# Patient Record
Sex: Female | Born: 1990 | Race: White | Hispanic: No | Marital: Married | Smoking: Never smoker
Health system: Southern US, Community
[De-identification: ages and names within clinical notes are randomized; demographics above are authoritative.]

## PROBLEM LIST (undated history)

## (undated) ENCOUNTER — Inpatient Hospital Stay (HOSPITAL_COMMUNITY): Payer: Self-pay

## (undated) DIAGNOSIS — Z789 Other specified health status: Secondary | ICD-10-CM

## (undated) DIAGNOSIS — E669 Obesity, unspecified: Secondary | ICD-10-CM

## (undated) HISTORY — PX: NO PAST SURGERIES: SHX2092

---

## 2013-12-17 ENCOUNTER — Encounter (HOSPITAL_COMMUNITY): Payer: Self-pay | Admitting: Emergency Medicine

## 2013-12-17 ENCOUNTER — Emergency Department (HOSPITAL_COMMUNITY)
Admission: EM | Admit: 2013-12-17 | Discharge: 2013-12-17 | Disposition: A | Discharge status: Home / Self Care | Payer: BC Managed Care – PPO | Attending: Emergency Medicine | Admitting: Emergency Medicine

## 2013-12-17 DIAGNOSIS — Y9289 Other specified places as the place of occurrence of the external cause: Secondary | ICD-10-CM | POA: Insufficient documentation

## 2013-12-17 DIAGNOSIS — Y9389 Activity, other specified: Secondary | ICD-10-CM | POA: Insufficient documentation

## 2013-12-17 DIAGNOSIS — W540XXA Bitten by dog, initial encounter: Secondary | ICD-10-CM | POA: Insufficient documentation

## 2013-12-17 DIAGNOSIS — S61209A Unspecified open wound of unspecified finger without damage to nail, initial encounter: Secondary | ICD-10-CM | POA: Insufficient documentation

## 2013-12-17 MED ORDER — MUPIROCIN CALCIUM 2 % EX CREA
TOPICAL_CREAM | Freq: Once | CUTANEOUS | Status: DC
  Filled 2013-12-17: qty 15

## 2013-12-17 MED ORDER — MUPIROCIN 2 % EX OINT
TOPICAL_OINTMENT | Freq: Two times a day (BID) | CUTANEOUS | Status: DC
  Administered 2013-12-17: 15:00:00 via NASAL
  Filled 2013-12-17: qty 22

## 2013-12-17 MED ORDER — AMOXICILLIN-POT CLAVULANATE 875-125 MG PO TABS: 1.0000 | ORAL_TABLET | Freq: Two times a day (BID) | ORAL | Status: DC

## 2013-12-17 NOTE — ED Provider Notes (Signed)
Medical screening examination/treatment/procedure(s) were performed by non-physician practitioner and as supervising physician I was immediately available for consultation/collaboration.   EKG Interpretation None        Layla MawKristen N Ward, DO 12/17/13 1642

## 2013-12-17 NOTE — ED Notes (Signed)
Pt. Bitten by a dog rt. 3rd finger.  Finger intact,  Bleeding controlled.  Open areas noted ,  Finger is swollen.  Pt. Is not sure if the dog is UTD with vaccinations.

## 2013-12-17 NOTE — ED Provider Notes (Signed)
CSN: [id number]     Arrival date & time 12/17/13  1349 History  This chart was scribed for non-physician practitioner, Junious SilkHannah Merrell, PA-C,working with Layla MawKristen N Ward, DO, by Karle PlumberJennifer Tensley, ED Scribe.  This patient was seen in room TR06C/TR06C and the patient's care was started at 2:03 PM.  Chief Complaint  Patient presents with  . Animal Bite    The history is provided by the patient. No language interpreter was used.   HPI Comments:  Judith Ruiz is a 23 y.o. female who presents to the Emergency Department complaining of a dog bite to her right third middle finger PTA. She states her dog got into a fight with the neighbor's dog and she stuck her hand between them trying to break them up. She reports associated mild pain and swelling. Pt states she is unsure of the dog's vaccination record, but her neighbor has possession of the dog. She denies numbness or tingling of the finger. She states she is UTD on her tetanus vaccination within 5 years. Pt states she is right hand dominant.   History reviewed. No pertinent past medical history. History reviewed. No pertinent past surgical history. No family history on file. History  Substance Use Topics  . Smoking status: Never Smoker   . Smokeless tobacco: Not on file  . Alcohol Use: No   OB History   Grav Para Term Preterm Abortions TAB SAB Ect Mult Living                 Review of Systems  Skin: Positive for wound (right middle finger).  Neurological: Negative for numbness.  All other systems reviewed and are negative.   Allergies  Review of patient's allergies indicates no known allergies.  Home Medications   Prior to Admission medications   Not on File   Filed Vitals:   12/17/13 1424  BP: 140/81  Pulse: 104  Temp: 98.8 F (37.1 C)  Resp: 20     Physical Exam  Nursing note and vitals reviewed. Constitutional: She is oriented to person, place, and time. She appears well-developed and well-nourished. No  distress.  HENT:  Head: Normocephalic and atraumatic.  Right Ear: External ear normal.  Left Ear: External ear normal.  Nose: Nose normal.  Mouth/Throat: Oropharynx is clear and moist.  Eyes: Conjunctivae are normal.  Neck: Normal range of motion.  Cardiovascular: Normal rate, regular rhythm and normal heart sounds.   Pulmonary/Chest: Effort normal and breath sounds normal. No stridor. No respiratory distress. She has no wheezes. She has no rales.  Abdominal: Soft. She exhibits no distension.  Musculoskeletal: Normal range of motion. She exhibits edema and tenderness.  Superficial lacerations on dorsal aspect of right middle finger. No foreign bodies. Flexion and extension of finger intact. Compartments soft. Mild swelling noted to finger. Very mild tenderness to palpation.   Neurological: She is alert and oriented to person, place, and time. She has normal strength.  Sensation intact.  Skin: Skin is warm and dry. She is not diaphoretic. No erythema.  Psychiatric: She has a normal mood and affect. Her behavior is normal.    ED Course  Procedures (including critical care time) DIAGNOSTIC STUDIES: Oxygen Saturation is 98% on RA, normal by my interpretation.   COORDINATION OF CARE: 2:07 PM- Will prescribe antibiotic. Offered medication for pain but pt declined. Pt verbalizes understanding and agrees to plan.  Medications  mupirocin ointment (BACTROBAN) 2 % ( Nasal Given 12/17/13 1435)    Labs Review Labs Reviewed -  No data to display  Imaging Review No results found.   EKG Interpretation None      MDM   Final diagnoses:  Dog bite    Patient presents to ED with complaint of dog bite. Wound cleaned in ED. Mild tenderness to palpation. No foreign bodies. Full ROM, flexion and extension. Patient was given Augmentin. Patient's neighbors own dog and still have dog in possession. She will contact animal control to ensure dog is UTD on rabies. Discussed strict return  instructions. Vital signs stable for discharge. Patient / Family / Caregiver informed of clinical course, understand medical decision-making process, and agree with plan.   I personally performed the services described in this documentation, which was scribed in my presence. The recorded information has been reviewed and is accurate.    Mora BellmanHannah S Merrell, PA-C 12/17/13 1441

## 2013-12-17 NOTE — Discharge Instructions (Signed)
It is very important you talk to your neighbor and animal control to ensure the dog is up to date on the rabies vaccine. If the dog is not up to date on it's rabies vaccine you must return to the emergency department for the rabies vaccine and immunoglobulin. Return to the ED sooner if you develop worsening swelling, drainage, fevers, increased pain, or any symptoms concerning to you.  Animal Bite Animal bite wounds can get infected. It is important to get proper medical treatment. Ask your doctor if you need a rabies shot. HOME CARE   Follow your doctor's instructions for taking care of your wound.  Only take medicine as told by your doctor.  Take your medicine (antibiotics) as told. Finish them even if you start to feel better.  Keep all doctor visits as told. You may need a tetanus shot if:   You cannot remember when you had your last tetanus shot.  You have never had a tetanus shot.  The injury broke your skin. If you need a tetanus shot and you choose not to have one, you may get tetanus. Sickness from tetanus can be serious. GET HELP RIGHT AWAY IF:   Your wound is warm, red, sore, or puffy (swollen).  You notice yellowish-white fluid (pus) or a bad smell coming from the wound.  You see a red line on the skin coming from the wound.  You have a fever, chills, or you feel sick.  You feel sick to your stomach (nauseous), or you throw up (vomit).  Your pain does not go away, or it gets worse.  You have trouble moving the injured part.  You have questions or concerns. MAKE SURE YOU:   Understand these instructions.  Will watch your condition.  Will get help right away if you are not doing well or get worse. Document Released: 06/16/2005 Document Revised: 09/08/2011 Document Reviewed: 02/05/2011 [facility] Patient Information 2015 [employer] [place]. This information is not intended to replace advice given to you by your health care provider. Make sure you discuss any questions  you have with your health care provider.

## 2013-12-23 ENCOUNTER — Ambulatory Visit (INDEPENDENT_AMBULATORY_CARE_PROVIDER_SITE_OTHER): Payer: BC Managed Care – PPO | Admitting: Family Medicine

## 2013-12-23 VITALS — BP 126/74 | HR 72 | Temp 98.0°F | Resp 16 | Wt 260.0 lb

## 2013-12-23 DIAGNOSIS — N926 Irregular menstruation, unspecified: Secondary | ICD-10-CM

## 2013-12-23 DIAGNOSIS — N912 Amenorrhea, unspecified: Secondary | ICD-10-CM

## 2013-12-23 LAB — POCT URINE PREGNANCY: Preg Test, Ur: NEGATIVE

## 2013-12-23 LAB — TSH: TSH: 3.489 u[IU]/mL (ref 0.350–4.500)

## 2013-12-23 NOTE — Patient Instructions (Signed)
The blood work is pending. If these do not give us an answer we will probably have to refer your to a specialist for further evaluation.

## 2013-12-23 NOTE — Progress Notes (Signed)
Subjective; 23 year old lady who has not had a regular menstrual cycle since April 8. She has been generally healthy. She's been married for about 6 months. They use condoms initially, but has not used any contraception. She is content with it if she is not pregnant. However she is run home pregnancy test is negative. She did do a little spotting on the 16th and 27th of April but none since. She has not had any major other symptoms such as breast tenderness or nausea. Her weight has been fairly constant over the last year, maybe a 5 pound loss. Physically she is healthy with the exception of a history of glaucoma for which she uses eyedrops.  Objective: Pleasant alert healthy appearing young lady in no major distress, overweight. No CVA tenderness. Abdomen soft with mild midline suprapubic tenderness.  Normal external genitalia. Vaginal mucosa unremarkable. Cervix appears normal. Bimanual exam reveals no adnexal or uterine masses. The patient expressed no desire and need for STD testing.  Results for orders placed in visit on 12/23/13  POCT URINE PREGNANCY      Result Value Ref Range   Preg Test, Ur Negative     Assessment: Secondary Amenorrhea, not pregnant   Plan: Pulmonary workup with FSH, prolactin, serum hCG, and TSH If these are not revealing, we will refer her to a gynecologist.

## 2013-12-24 LAB — HCG, SERUM, QUALITATIVE: Preg, Serum: NEGATIVE

## 2013-12-24 LAB — FOLLICLE STIMULATING HORMONE: FSH: 7.4 m[IU]/mL

## 2013-12-24 LAB — PROLACTIN: Prolactin: 5.5 ng/mL

## 2014-08-24 ENCOUNTER — Ambulatory Visit (INDEPENDENT_AMBULATORY_CARE_PROVIDER_SITE_OTHER): Payer: BLUE CROSS/BLUE SHIELD | Admitting: Sports Medicine

## 2014-08-24 VITALS — BP 110/76 | HR 79 | Temp 98.5°F | Resp 20 | Ht 64.5 in | Wt 270.2 lb

## 2014-08-24 DIAGNOSIS — Z23 Encounter for immunization: Secondary | ICD-10-CM

## 2014-08-24 DIAGNOSIS — N915 Oligomenorrhea, unspecified: Secondary | ICD-10-CM

## 2014-08-24 NOTE — Progress Notes (Addendum)
I have reviewed and agree with documentation by Dr. Berline Choughigby.  Called pt and asked her to take a home HCG to ensure no current pregnancy and she plans to do so

## 2014-08-24 NOTE — Progress Notes (Signed)
  Gerre PebblesChelsey Martinez - 24 y.o. female MRN [id number]  Date of birth: Nov 13, 1990  SUBJECTIVE: CC:  Chief Complaint  Patient presents with  . Immunizations    HPV & HEP A    HPI: Needs final immunization  Pt has [facility] records with her printed 08/23/2014.   Guardisil & Hep A - 11/2010  Guardisil - 12/2010   Health Maintenance  Due for PAP - declines today  Persistent Oligomenorrhea, reports LMP approximately 6 weeks ago with 2-3 days of bleeding  No contraception; not actively trying to become pregnant but would be happy if she became pregnant  ROS: no fevers, chills, unexpected wt gain or wt loss however 10# gain Wt Readings from Last 3 Encounters:  08/24/14 270 lb 4 oz (122.585 kg)  12/23/13 260 lb (117.935 kg)   HISTORY:  Past Medical, Surgical, Social, and Family History reviewed & updated per EMR.  Pertinent Historical Findings include:  reports that she has never smoked. She has never used smokeless tobacco. Obesity & Oligiomenorrhea Otherwise Healthy   Historical Data Reviewed: Results for Gerre PebblesMARTINEZ, Lynzi (MRN [id number]) as of 08/24/2014 20:47  12/23/2013 12:24  FSH 7.4  Prolactin 5.5  Preg, Serum NEG  TSH 3.489    OBJECTIVE:  VS:   HT:5' 4.5" (163.8 cm)   WT:270 lb 4 oz (122.585 kg)  BMI:45.8          BP:110/76 mmHg  HR:79bpm  TEMP:98.5 F (36.9 C)(Oral)  RESP:99 %  PHYSICAL EXAM:  Physical Exam  Constitutional: She is well-developed, well-nourished, and in no distress. No distress.  HENT:  Head: Normocephalic and atraumatic.  Eyes: Right eye exhibits no discharge. Left eye exhibits no discharge. No scleral icterus.  Pulmonary/Chest: Effort normal. No respiratory distress.  Skin: She is not diaphoretic.  Psychiatric: Mood, memory, affect and judgment normal.   ASSESSMENT: 1. Oligomenorrhea    PLAN: See problem based charting & AVS for additional documentation.  Final Immunizations for Hep A & Guardsil  Refer to OB for Wellness visit and  Oligiomenorrhea > Return if symptoms worsen or fail to improve.

## 2014-08-28 ENCOUNTER — Telehealth: Payer: Self-pay | Admitting: Family Medicine

## 2014-08-28 NOTE — Telephone Encounter (Signed)
Called to check in with her- asked her to take a home HCG to confirm not pregnant.  She plans to do so

## 2014-08-28 NOTE — Telephone Encounter (Signed)
-----   Message from Andrena MewsMichael D Rigby, DO sent at 08/25/2014  4:56 PM EST ----- Yes I did ask her.  LMP ~6 weeks ago with 2-3 days of bleeding; reportedly normal and long standing for her.  Would you have still checked UPREG before giving the Hep A?

## 2015-01-04 ENCOUNTER — Ambulatory Visit (INDEPENDENT_AMBULATORY_CARE_PROVIDER_SITE_OTHER): Payer: BLUE CROSS/BLUE SHIELD | Admitting: Family Medicine

## 2015-01-04 VITALS — BP 110/80 | HR 90 | Temp 98.9°F | Resp 16 | Ht 64.5 in | Wt 267.8 lb

## 2015-01-04 DIAGNOSIS — O99210 Obesity complicating pregnancy, unspecified trimester: Secondary | ICD-10-CM | POA: Insufficient documentation

## 2015-01-04 DIAGNOSIS — Z3200 Encounter for pregnancy test, result unknown: Secondary | ICD-10-CM

## 2015-01-04 DIAGNOSIS — E669 Obesity, unspecified: Secondary | ICD-10-CM

## 2015-01-04 LAB — POCT URINE PREGNANCY: Preg Test, Ur: POSITIVE — AB

## 2015-01-04 NOTE — Patient Instructions (Signed)
Congratulations!   Take good care of yourself, get enough rest and take your pre-natal vitamin A book such as "what to expect while your expecting" or "your pregnancy week by week" can be helpful If you have bleeding, pain, or other concerning symptoms [facility] [address] It is hard to estimate your due date due to your irregular periods- an ultrasound will help here!   Please call and schedule with the OB-GYN provider of your choice asap-   [facility] ?  Address: [address], [place], [place] [address]  Phone: [phone]   [facility] ?  Address: [address], [place], [place] [address]  Phone: [phone]   [facility] [address] [place], [place] [address] [phone]

## 2015-01-04 NOTE — Progress Notes (Signed)
[facility] [address], [place] [address] [phone]  Date:  01/04/2015   Name:  Judith Ruiz   DOB:  09/11/1990   MRN:  [id number]  PCP:  No PCP Per Patient    Chief Complaint: pregnancy test request   History of Present Illness:  Judith Ruiz is a 24 y.o. very pleasant female patient who presents with the following:  Here today seeking a pregnancy test She took a positive home test - she took this about one week ago She is married -for aboug 18 months- and is pleased about the pregnancy Her LMP was 5/6-no bleeding since then, but she does tend to have irregular bleeding. She took a home test mid June which was negative so she suspect she concieved more recently.   She has noted breast tenderness and some cramping but no pain, no bleeding. These sx have been present for about 2 weeks   This is her first pregnacy She is otherwise in good health She does not have any OBG- needs to fine one She just started PNV yesterday   There are no active problems to display for this patient.   History reviewed. No pertinent past medical history.  History reviewed. No pertinent past surgical history.  History  Substance Use Topics  . Smoking status: Never Smoker   . Smokeless tobacco: Never Used  . Alcohol Use: No    Family History  Problem Relation Age of Onset  . Hyperlipidemia Father     No Known Allergies  Medication list has been reviewed and updated.  No current outpatient prescriptions on file prior to visit.   No current facility-administered medications on file prior to visit.    Review of Systems:  As per HPI- otherwise negative.   Physical Examination: Filed Vitals:   01/04/15 1129  BP: 110/80  Pulse: 100  Temp: 98.9 F (37.2 C)  Resp: 16   Filed Vitals:   01/04/15 1129  Height: 5' 4.5" (1.638 m)  Weight: 267 lb 12.8 oz (121.473 kg)   Body mass index is 45.27 kg/(m^2). Ideal Body Weight: Weight in (lb) to  have BMI = 25: 147.6  GEN: WDWN, NAD, Non-toxic, A & O x 3, obese, looks well HEENT: Atraumatic, Normocephalic. Neck supple. No masses, No LAD. Ears and Nose: No external deformity. CV: RRR, No M/G/R. No JVD. No thrill. No extra heart sounds. PULM: CTA B, no wheezes, crackles, rhonchi. No retractions. No resp. distress. No accessory muscle use. ABD: S, NT, ND. No rebound. No HSM.  Belly is benign EXTR: No c/c/e NEURO Normal gait.  PSYCH: Normally interactive. Conversant. Not depressed or anxious appearing.  Calm demeanor.   Results for orders placed or performed in visit on 01/04/15  POCT urine pregnancy  Result Value Ref Range   Preg Test, Ur Positive (A) Negative    Assessment and Plan: Possible pregnancy - Plan: POCT urine pregnancy  Found to be pregnant today- she is pleased.  Dates are uncertain due to irregular bleeding Discussed self- care, continue PNV, encouraged her to see the OBG of her choice asap   Signed Abbe AmsterdamJessica Copland, MD

## 2015-02-05 LAB — OB RESULTS CONSOLE ANTIBODY SCREEN: Antibody Screen: NEGATIVE

## 2015-02-05 LAB — OB RESULTS CONSOLE GC/CHLAMYDIA
Chlamydia: NEGATIVE
Gonorrhea: NEGATIVE

## 2015-02-05 LAB — OB RESULTS CONSOLE ABO/RH: RH Type: NEGATIVE

## 2015-02-05 LAB — OB RESULTS CONSOLE RUBELLA ANTIBODY, IGM: Rubella: IMMUNE

## 2015-02-05 LAB — OB RESULTS CONSOLE HEPATITIS B SURFACE ANTIGEN: HEP B S AG: NEGATIVE

## 2015-02-05 LAB — OB RESULTS CONSOLE RPR: RPR: NONREACTIVE

## 2015-02-05 LAB — OB RESULTS CONSOLE HIV ANTIBODY (ROUTINE TESTING): HIV: NONREACTIVE

## 2015-07-01 NOTE — L&D Delivery Note (Signed)
Delivery Note At 7:02 PM a viable and healthy female was delivered via Vaginal, Vacuum (Extractor) (Presentation: Left Occiput Anterior).  APGAR: 8, 9; weight pending .   Placenta status: Intact, Spontaneous.  Cord: 3 vessels   With pushing the fetal tracing was noted to be having decelerations after contractions. The Mother was noted to be tachycardic and it was difficult to determine whether the FHR was tracing maternal or fetal. The pt had pushed the vertex almost to crowning and the decision was made to expedite delivery with the kiwi vacuum. The vacuum was applied and with a single contractions the infants head was delivered to crowning without a pop-off. The infants body was then delivered and the passed to the waiting to the maternal abdomen. With pushing the maternal core was palpably warm and there was an odor of chorioamnionitis with delivery of the baby. The cord was clamped and cut and the placenta was delivered spontaneously, intact, with 3V cord. Right after delivery the uterus was noted to be boggy. This resolved with bimanual massage. A 2nd degree perineal laceration was repaired with 3-0 vicryl.   Anesthesia: Epidural  Episiotomy: None Lacerations: 2nd degree;Perineal Suture Repair: 3.0 vicryl Est. Blood Loss (mL):  500 cc  Mom to postpartum.  Baby to Couplet care / Skin to Skin.  Caitriona Sundquist H. 08/28/2015, 7:55 PM

## 2015-07-27 LAB — OB RESULTS CONSOLE GBS: STREP GROUP B AG: NEGATIVE

## 2015-08-22 ENCOUNTER — Encounter (HOSPITAL_COMMUNITY): Payer: Self-pay | Admitting: Advanced Practice Midwife

## 2015-08-22 ENCOUNTER — Inpatient Hospital Stay (HOSPITAL_COMMUNITY)
Admission: AD | Admit: 2015-08-22 | Discharge: 2015-08-22 | Disposition: A | Discharge status: Home / Self Care | Payer: Medicaid Other | Source: Ambulatory Visit | Attending: Obstetrics and Gynecology | Admitting: Obstetrics and Gynecology

## 2015-08-22 DIAGNOSIS — O133 Gestational [pregnancy-induced] hypertension without significant proteinuria, third trimester: Secondary | ICD-10-CM | POA: Diagnosis not present

## 2015-08-22 DIAGNOSIS — I10 Essential (primary) hypertension: Secondary | ICD-10-CM | POA: Diagnosis present

## 2015-08-22 DIAGNOSIS — Z3A39 39 weeks gestation of pregnancy: Secondary | ICD-10-CM | POA: Diagnosis not present

## 2015-08-22 HISTORY — DX: Other specified health status: Z78.9

## 2015-08-22 LAB — URINALYSIS, ROUTINE W REFLEX MICROSCOPIC
BILIRUBIN URINE: NEGATIVE
Glucose, UA: NEGATIVE mg/dL
Ketones, ur: NEGATIVE mg/dL
Leukocytes, UA: NEGATIVE
NITRITE: NEGATIVE
PROTEIN: 30 mg/dL — AB
Specific Gravity, Urine: 1.015 (ref 1.005–1.030)
pH: 7 (ref 5.0–8.0)

## 2015-08-22 LAB — CBC
HCT: 32.7 % — ABNORMAL LOW (ref 36.0–46.0)
Hemoglobin: 10.6 g/dL — ABNORMAL LOW (ref 12.0–15.0)
MCH: 24 pg — ABNORMAL LOW (ref 26.0–34.0)
MCHC: 32.4 g/dL (ref 30.0–36.0)
MCV: 74.1 fL — ABNORMAL LOW (ref 78.0–100.0)
Platelets: 270 10*3/uL (ref 150–400)
RBC: 4.41 MIL/uL (ref 3.87–5.11)
RDW: 15.3 % (ref 11.5–15.5)
WBC: 10.1 10*3/uL (ref 4.0–10.5)

## 2015-08-22 LAB — COMPREHENSIVE METABOLIC PANEL
ALT: 15 U/L (ref 14–54)
AST: 16 U/L (ref 15–41)
Albumin: 2.9 g/dL — ABNORMAL LOW (ref 3.5–5.0)
Alkaline Phosphatase: 168 U/L — ABNORMAL HIGH (ref 38–126)
Anion gap: 15 (ref 5–15)
BUN: 10 mg/dL (ref 6–20)
CHLORIDE: 104 mmol/L (ref 101–111)
CO2: 20 mmol/L — ABNORMAL LOW (ref 22–32)
CREATININE: 0.57 mg/dL (ref 0.44–1.00)
Calcium: 8.5 mg/dL — ABNORMAL LOW (ref 8.9–10.3)
GFR calc non Af Amer: >60 mL/min (ref >60)
Glucose, Bld: 78 mg/dL (ref 65–99)
POTASSIUM: 3.5 mmol/L (ref 3.5–5.1)
Sodium: 139 mmol/L (ref 135–145)
Total Bilirubin: 0.4 mg/dL (ref 0.3–1.2)
Total Protein: 6.9 g/dL (ref 6.5–8.1)

## 2015-08-22 LAB — PROTEIN / CREATININE RATIO, URINE
Creatinine, Urine: 178 mg/dL
Protein Creatinine Ratio: 0.19 mg/mg{Cre} — ABNORMAL HIGH (ref 0.00–0.15)
TOTAL PROTEIN, URINE: 33 mg/dL

## 2015-08-22 LAB — URINE MICROSCOPIC-ADD ON

## 2015-08-22 NOTE — MAU Note (Signed)
Pt was sent here from the office to monitor BPs, was "high in the office today and some protein has been in my urine the last couple of visits."  Denies any headaches or blurred vision; denies any bleeding or leaking of fluid. Reports positive fetal movement.

## 2015-08-22 NOTE — MAU Provider Note (Signed)
Chief Complaint:  Hypertension   First Provider Initiated Contact with Patient 08/22/15 2015     HPI: Judith Ruiz is a 25 y.o. G1P0 at 86w0dwho presents to maternity admissions reporting elevated BPs in office with proteinuria. She reports good fetal movement, denies LOF, vaginal bleeding, vaginal itching/burning, urinary symptoms, h/a, dizziness, n/v, diarrhea, constipation or fever/chills.  She denies headache, visual changes or abdominal pain.  Hypertension This is a new problem. The current episode started in the past 7 days. The problem has been waxing and waning since onset. Associated symptoms include peripheral edema (pedal and pretibial). Pertinent negatives include no anxiety, blurred vision, chest pain, headaches, malaise/fatigue, palpitations, shortness of breath or sweats. There are no associated agents to hypertension. There are no known risk factors for coronary artery disease. Past treatments include nothing. There are no compliance problems.  pregnancy.   RN Note: Pt was sent here from the office to monitor BPs, was "high in the office today and some protein has been in my urine the last couple of visits." Denies any headaches or blurred vision; denies any bleeding or leaking of fluid. Reports positive fetal movement.            Past Medical History: Past Medical History  Diagnosis Date  . Medical history non-contributory     Past obstetric history: OB History  Gravida Para Term Preterm AB SAB TAB Ectopic Multiple Living  1             # Outcome Date GA Lbr Len/2nd Weight Sex Delivery Anes PTL Lv  1 Current               Past Surgical History: Past Surgical History  Procedure Laterality Date  . No past surgeries      Family History: Family History  Problem Relation Age of Onset  . Hyperlipidemia Father     Social History: Social History  Substance Use Topics  . Smoking status: Never Smoker   . Smokeless tobacco: Never Used  . Alcohol  Use: No    Allergies: No Known Allergies  Meds:  Prescriptions prior to admission  Medication Sig Dispense Refill Last Dose  . Prenatal Vit-Fe Fumarate-FA (PRENATAL MULTIVITAMIN) TABS tablet Take 1 tablet by mouth daily at 12 noon.   Past Week at Unknown time    I have reviewed patient's Past Medical Hx, Surgical Hx, Family Hx, Social Hx, medications and allergies.   ROS:  Review of Systems  Constitutional: Negative for fever, chills, malaise/fatigue and fatigue.  HENT: Negative for sinus pressure.   Eyes: Negative for blurred vision.  Respiratory: Negative for cough and shortness of breath.   Cardiovascular: Positive for leg swelling. Negative for chest pain and palpitations.  Gastrointestinal: Negative for nausea, vomiting, abdominal pain, diarrhea and constipation.  Genitourinary: Negative for dysuria, vaginal bleeding, vaginal discharge and pelvic pain.  Musculoskeletal: Negative for back pain.  Neurological: Negative for dizziness, syncope, speech difficulty, weakness and headaches.   Other systems negative  Physical Exam  Patient Vitals for the past 24 hrs:  BP Temp Temp src Pulse Resp SpO2 Height Weight  08/22/15 1956 126/80 mmHg 98.8 F (37.1 C) Oral 100 20 99 %  (1.651 m) 290 lb (131.543 kg)    Constitutional: Well-developed, well-nourished female in no acute distress.  Cardiovascular: normal rate and rhythm Respiratory: normal effort, clear to auscultation bilaterally GI: Abd soft, non-tender, gravid appropriate for gestational age.   No rebound or guarding. MS: Extremities nontender, no edema, normal ROM  Neurologic: Alert and oriented x 4.   DTRs 2+, no clonus, speech not impaired.  GU: Neg CVAT.    FHT:  Baseline 135-140 , moderate variability, accelerations present, no decelerations Contractions: occasional irritability   Labs:  Imaging:  No results found.  MAU Course/MDM: .2136: D/W Dr. Tenny Craw, and reviewed labs. Patient is ok for DC home.      Assessment: 1. Transient hypertension of pregnancy in third trimester     Plan: Report given to oncoming CNM Awaiting lab results   DC home Comfort measures reviewed  3rd Trimester precautions  Labor precautions  Fetal kick counts Pre-eclampsia warning signs  RX: none  Return to MAU as needed FU with OB as planned  Follow-up Information    Follow up with Almon Hercules., MD.   Specialty:  Obstetrics and Gynecology   Why:  As scheduled   Contact information:   [address] [place] [address] [phone]        Wynelle Bourgeois CNM, MSN Certified Nurse-Midwife 08/22/2015 8:15 PM

## 2015-08-22 NOTE — Discharge Instructions (Signed)

## 2015-08-24 ENCOUNTER — Other Ambulatory Visit: Payer: Self-pay | Admitting: Obstetrics and Gynecology

## 2015-08-27 ENCOUNTER — Inpatient Hospital Stay (HOSPITAL_COMMUNITY): Payer: Medicaid Other | Admitting: Anesthesiology

## 2015-08-27 ENCOUNTER — Inpatient Hospital Stay (HOSPITAL_COMMUNITY)
Admission: RE | Admit: 2015-08-27 | Discharge: 2015-08-30 | DRG: 775 | Disposition: A | Discharge status: Home / Self Care | Payer: Medicaid Other | Source: Ambulatory Visit | Attending: Obstetrics and Gynecology | Admitting: Obstetrics and Gynecology

## 2015-08-27 ENCOUNTER — Encounter (HOSPITAL_COMMUNITY): Payer: Self-pay

## 2015-08-27 DIAGNOSIS — O99214 Obesity complicating childbirth: Secondary | ICD-10-CM | POA: Diagnosis present

## 2015-08-27 DIAGNOSIS — Z6841 Body Mass Index (BMI) 40.0 and over, adult: Secondary | ICD-10-CM | POA: Diagnosis not present

## 2015-08-27 DIAGNOSIS — Z3A35 35 weeks gestation of pregnancy: Secondary | ICD-10-CM | POA: Diagnosis not present

## 2015-08-27 DIAGNOSIS — O1494 Unspecified pre-eclampsia, complicating childbirth: Principal | ICD-10-CM | POA: Diagnosis present

## 2015-08-27 LAB — TYPE AND SCREEN
ABO/RH(D): O NEG
ANTIBODY SCREEN: NEGATIVE

## 2015-08-27 LAB — CBC
HCT: 32.5 % — ABNORMAL LOW (ref 36.0–46.0)
Hemoglobin: 10.2 g/dL — ABNORMAL LOW (ref 12.0–15.0)
MCH: 23.6 pg — ABNORMAL LOW (ref 26.0–34.0)
MCHC: 31.4 g/dL (ref 30.0–36.0)
MCV: 75.1 fL — AB (ref 78.0–100.0)
PLATELETS: 282 10*3/uL (ref 150–400)
RBC: 4.33 MIL/uL (ref 3.87–5.11)
RDW: 15.7 % — AB (ref 11.5–15.5)
WBC: 10.5 10*3/uL (ref 4.0–10.5)

## 2015-08-27 LAB — ABO/RH: ABO/RH(D): O NEG

## 2015-08-27 MED ORDER — OXYTOCIN 10 UNIT/ML IJ SOLN
2.5000 [IU]/h | INTRAVENOUS | Status: DC
  Filled 2015-08-27: qty 4

## 2015-08-27 MED ORDER — LACTATED RINGERS IV SOLN
INTRAVENOUS | Status: DC
  Administered 2015-08-27: 08:00:00 via INTRAVENOUS
  Administered 2015-08-28: 125 mL/h via INTRAVENOUS

## 2015-08-27 MED ORDER — LIDOCAINE HCL (PF) 1 % IJ SOLN
INTRAMUSCULAR | Status: DC | PRN
  Administered 2015-08-27: 3 mL via EPIDURAL
  Administered 2015-08-27: 5 mL
  Administered 2015-08-27: 2 mL via EPIDURAL

## 2015-08-27 MED ORDER — PHENYLEPHRINE 40 MCG/ML (10ML) SYRINGE FOR IV PUSH (FOR BLOOD PRESSURE SUPPORT)
80.0000 ug | PREFILLED_SYRINGE | INTRAVENOUS | Status: DC | PRN
  Filled 2015-08-27: qty 2

## 2015-08-27 MED ORDER — LIDOCAINE HCL (PF) 1 % IJ SOLN
30.0000 mL | INTRAMUSCULAR | Status: DC | PRN
  Filled 2015-08-27: qty 30

## 2015-08-27 MED ORDER — FENTANYL 2.5 MCG/ML BUPIVACAINE 1/10 % EPIDURAL INFUSION (WH - ANES)
14.0000 mL/h | INTRAMUSCULAR | Status: DC | PRN
  Administered 2015-08-27 – 2015-08-28 (×5): 14 mL/h via EPIDURAL
  Filled 2015-08-27 (×4): qty 125

## 2015-08-27 MED ORDER — PHENYLEPHRINE 40 MCG/ML (10ML) SYRINGE FOR IV PUSH (FOR BLOOD PRESSURE SUPPORT)
80.0000 ug | PREFILLED_SYRINGE | INTRAVENOUS | Status: DC | PRN
  Filled 2015-08-27: qty 2
  Filled 2015-08-27: qty 20

## 2015-08-27 MED ORDER — LACTATED RINGERS IV SOLN
1.0000 m[IU]/min | INTRAVENOUS | Status: DC
  Administered 2015-08-28: 24 m[IU]/min via INTRAVENOUS

## 2015-08-27 MED ORDER — OXYTOCIN BOLUS FROM INFUSION
500.0000 mL | INTRAVENOUS | Status: DC
  Administered 2015-08-28: 500 mL via INTRAVENOUS

## 2015-08-27 MED ORDER — EPHEDRINE 5 MG/ML INJ
10.0000 mg | INTRAVENOUS | Status: DC | PRN
Start: 2015-08-27 — End: 2015-08-28
  Filled 2015-08-27: qty 2

## 2015-08-27 MED ORDER — OXYCODONE-ACETAMINOPHEN 5-325 MG PO TABS: 1.0000 | ORAL_TABLET | ORAL | Status: DC | PRN

## 2015-08-27 MED ORDER — LACTATED RINGERS IV SOLN: 500.0000 mL | Freq: Once | INTRAVENOUS | Status: DC

## 2015-08-27 MED ORDER — EPHEDRINE 5 MG/ML INJ
10.0000 mg | INTRAVENOUS | Status: DC | PRN
  Filled 2015-08-27: qty 2

## 2015-08-27 MED ORDER — DIPHENHYDRAMINE HCL 50 MG/ML IJ SOLN
12.5000 mg | INTRAMUSCULAR | Status: DC | PRN
Start: 2015-08-27 — End: 2015-08-28

## 2015-08-27 MED ORDER — ACETAMINOPHEN 325 MG PO TABS: 650.0000 mg | ORAL_TABLET | ORAL | Status: DC | PRN

## 2015-08-27 MED ORDER — OXYTOCIN 10 UNIT/ML IJ SOLN
1.0000 m[IU]/min | INTRAVENOUS | Status: DC
  Administered 2015-08-27: 1 m[IU]/min via INTRAVENOUS
  Filled 2015-08-27: qty 4

## 2015-08-27 MED ORDER — EPHEDRINE 5 MG/ML INJ: 10.0000 mg | INTRAVENOUS | Status: DC | PRN

## 2015-08-27 MED ORDER — PHENYLEPHRINE 40 MCG/ML (10ML) SYRINGE FOR IV PUSH (FOR BLOOD PRESSURE SUPPORT): 80.0000 ug | PREFILLED_SYRINGE | INTRAVENOUS | Status: DC | PRN

## 2015-08-27 MED ORDER — CITRIC ACID-SODIUM CITRATE 334-500 MG/5ML PO SOLN: 30.0000 mL | ORAL | Status: DC | PRN

## 2015-08-27 MED ORDER — FLEET ENEMA 7-19 GM/118ML RE ENEM: 1.0000 | ENEMA | RECTAL | Status: DC | PRN

## 2015-08-27 MED ORDER — LACTATED RINGERS IV SOLN
500.0000 mL | INTRAVENOUS | Status: DC | PRN
  Administered 2015-08-27 – 2015-08-28 (×2): 500 mL via INTRAVENOUS

## 2015-08-27 MED ORDER — OXYCODONE-ACETAMINOPHEN 5-325 MG PO TABS: 2.0000 | ORAL_TABLET | ORAL | Status: DC | PRN

## 2015-08-27 MED ORDER — TERBUTALINE SULFATE 1 MG/ML IJ SOLN
0.2500 mg | Freq: Once | INTRAMUSCULAR | Status: DC | PRN
  Filled 2015-08-27: qty 1

## 2015-08-27 MED ORDER — ONDANSETRON HCL 4 MG/2ML IJ SOLN: 4.0000 mg | Freq: Four times a day (QID) | INTRAMUSCULAR | Status: DC | PRN

## 2015-08-27 NOTE — Anesthesia Procedure Notes (Signed)
Epidural Patient location during procedure: OB  Staffing Anesthesiologist: Marcene Duos Performed by: anesthesiologist   Preanesthetic Checklist Completed: patient identified, site marked, surgical consent, pre-op evaluation, timeout performed, IV checked, risks and benefits discussed and monitors and equipment checked  Epidural Patient position: sitting Prep: site prepped and draped and DuraPrep Patient monitoring: continuous pulse ox and blood pressure Approach: midline Location: L3-L4 Injection technique: LOR saline  Needle:  Needle type: Tuohy  Needle gauge: 17 G Needle length: 9 cm and 9 Needle insertion depth: 8 cm Catheter type: closed end flexible Catheter size: 19 Gauge Catheter at skin depth: 15 (13cm at skin initially. advanced to 15.5cm when pt laid in right lateral decub. 5cm in space.) cm Test dose: negative  Assessment Events: blood not aspirated, injection not painful, no injection resistance, negative IV test and no paresthesia

## 2015-08-27 NOTE — Anesthesia Preprocedure Evaluation (Signed)
Anesthesia Evaluation  Patient identified by MRN, date of birth, ID band Patient awake    Reviewed: Allergy & Precautions, Patient's Chart, lab work & pertinent test results  Airway Mallampati: III       Dental   Pulmonary neg pulmonary ROS,    Pulmonary exam normal        Cardiovascular hypertension (Pre-E), Normal cardiovascular exam     Neuro/Psych negative neurological ROS     GI/Hepatic negative GI ROS, Neg liver ROS,   Endo/Other  Morbid obesity  Renal/GU negative Renal ROS     Musculoskeletal   Abdominal   Peds  Hematology negative hematology ROS (+)   Anesthesia Other Findings   Reproductive/Obstetrics (+) Pregnancy                             Lab Results  Component Value Date   WBC 10.5 08/27/2015   HGB 10.2* 08/27/2015   HCT 32.5* 08/27/2015   MCV 75.1* 08/27/2015   PLT 282 08/27/2015   Lab Results  Component Value Date   CREATININE 0.57 08/22/2015   BUN 10 08/22/2015   NA 139 08/22/2015   K 3.5 08/22/2015   CL 104 08/22/2015   CO2 20* 08/22/2015    Anesthesia Physical Anesthesia Plan  ASA: III  Anesthesia Plan: Epidural   Post-op Pain Management:    Induction:   Airway Management Planned: Natural Airway  Additional Equipment:   Intra-op Plan:   Post-operative Plan:   Informed Consent: I have reviewed the patients History and Physical, chart, labs and discussed the procedure including the risks, benefits and alternatives for the proposed anesthesia with the patient or authorized representative who has indicated his/her understanding and acceptance.     Plan Discussed with:   Anesthesia Plan Comments:         Anesthesia Quick Evaluation

## 2015-08-27 NOTE — Consults (Signed)
  Anesthesia Pain Consult Note  Patient: Judith Ruiz, 25 y.o., female  Consult Requested by: Philip Aspen, DO  Reason for Consult: CRNA Pain Rounding  Level of Consciousness: alert  Pain: mild   Pain Goal: 8

## 2015-08-27 NOTE — H&P (Addendum)
25 y.o. [redacted]w[redacted]d  G1P0 comes in for schedule IOL for preeclampsia.   On office visit at 35 weeks +1 protein noted on dip.  24hr urine collected and found to be 387g.  At that time BPs were normal range but have since starting increasing.  As of 39 weeks meets criteria for preeclampsia.  Otherwise has good fetal movement and no bleeding.  Past Medical History  Diagnosis Date  . Medical history non-contributory     Past Surgical History  Procedure Laterality Date  . No past surgeries      OB History  Gravida Para Term Preterm AB SAB TAB Ectopic Multiple Living  1             # Outcome Date GA Lbr Len/2nd Weight Sex Delivery Anes PTL Lv  1 Current               Social History   Social History  . Marital Status: Married    Spouse Name: N/A  . Number of Children: N/A  . Years of Education: N/A   Occupational History  . Not on file.   Social History Main Topics  . Smoking status: Never Smoker   . Smokeless tobacco: Never Used  . Alcohol Use: No  . Drug Use: No  . Sexual Activity: Yes    Birth Control/ Protection: None   Other Topics Concern  . Not on file   Social History Narrative   Review of patient's allergies indicates no known allergies.    Prenatal Transfer Tool  Maternal Diabetes: No Genetic Screening: Declined Maternal Ultrasounds/Referrals: Normal Fetal Ultrasounds or other Referrals:  None Maternal Substance Abuse:  No Significant Maternal Medications:  None Significant Maternal Lab Results: Lab values include: Group B Strep negative  Other PNC: uncomplicated.    Filed Vitals:   08/27/15 0856 08/27/15 0937  BP: 127/67 136/72  Pulse: 105 97  Temp:    Resp: 20 20     Lungs/Cor:  NAD Abdomen:  soft, gravid Ex:  no cords, erythema SVE:  2/70/-3 FHTs:  135, good STV, NST R Toco:  UI   A/P   Admitted for IOL d/t PEC ALT/AST/Plt wnl BPs normal and mild range  GBS Neg Pitocin started at 2x2 [place], [facility]

## 2015-08-28 ENCOUNTER — Encounter (HOSPITAL_COMMUNITY): Payer: Self-pay

## 2015-08-28 LAB — CBC
HCT: 30.2 % — ABNORMAL LOW (ref 36.0–46.0)
HEMOGLOBIN: 9.6 g/dL — AB (ref 12.0–15.0)
MCH: 23.6 pg — AB (ref 26.0–34.0)
MCHC: 31.8 g/dL (ref 30.0–36.0)
MCV: 74.2 fL — ABNORMAL LOW (ref 78.0–100.0)
PLATELETS: 288 10*3/uL (ref 150–400)
RBC: 4.07 MIL/uL (ref 3.87–5.11)
RDW: 15.8 % — AB (ref 11.5–15.5)
WBC: 22.7 10*3/uL — ABNORMAL HIGH (ref 4.0–10.5)

## 2015-08-28 LAB — RPR: RPR Ser Ql: NONREACTIVE

## 2015-08-28 MED ORDER — PRENATAL MULTIVITAMIN CH
1.0000 | ORAL_TABLET | Freq: Every day | ORAL | Status: DC
  Administered 2015-08-29 – 2015-08-30 (×2): 1 via ORAL
  Filled 2015-08-28 (×2): qty 1

## 2015-08-28 MED ORDER — SIMETHICONE 80 MG PO CHEW: 80.0000 mg | CHEWABLE_TABLET | ORAL | Status: DC | PRN

## 2015-08-28 MED ORDER — ONDANSETRON HCL 4 MG/2ML IJ SOLN: 4.0000 mg | INTRAMUSCULAR | Status: DC | PRN

## 2015-08-28 MED ORDER — ZOLPIDEM TARTRATE 5 MG PO TABS: 5.0000 mg | ORAL_TABLET | Freq: Every evening | ORAL | Status: DC | PRN

## 2015-08-28 MED ORDER — TETANUS-DIPHTH-ACELL PERTUSSIS 5-2.5-18.5 LF-MCG/0.5 IM SUSP: 0.5000 mL | Freq: Once | INTRAMUSCULAR | Status: DC

## 2015-08-28 MED ORDER — EPHEDRINE 5 MG/ML INJ: 10.0000 mg | INTRAVENOUS | Status: DC | PRN

## 2015-08-28 MED ORDER — BUPIVACAINE HCL (PF) 0.25 % IJ SOLN
INTRAMUSCULAR | Status: DC | PRN
  Administered 2015-08-28: 5 mL via EPIDURAL

## 2015-08-28 MED ORDER — SODIUM BICARBONATE 8.4 % IV SOLN
INTRAVENOUS | Status: DC | PRN
  Administered 2015-08-28: 3 mL via EPIDURAL

## 2015-08-28 MED ORDER — ACETAMINOPHEN 325 MG PO TABS: 650.0000 mg | ORAL_TABLET | ORAL | Status: DC | PRN

## 2015-08-28 MED ORDER — LACTATED RINGERS IV SOLN: 500.0000 mL | Freq: Once | INTRAVENOUS | Status: DC

## 2015-08-28 MED ORDER — IBUPROFEN 600 MG PO TABS
600.0000 mg | ORAL_TABLET | Freq: Four times a day (QID) | ORAL | Status: DC
  Administered 2015-08-29 – 2015-08-30 (×6): 600 mg via ORAL
  Filled 2015-08-28 (×7): qty 1

## 2015-08-28 MED ORDER — PHENYLEPHRINE 40 MCG/ML (10ML) SYRINGE FOR IV PUSH (FOR BLOOD PRESSURE SUPPORT): 80.0000 ug | PREFILLED_SYRINGE | INTRAVENOUS | Status: DC | PRN

## 2015-08-28 MED ORDER — DIBUCAINE 1 % RE OINT: 1.0000 "application " | TOPICAL_OINTMENT | RECTAL | Status: DC | PRN

## 2015-08-28 MED ORDER — DIPHENHYDRAMINE HCL 25 MG PO CAPS: 25.0000 mg | ORAL_CAPSULE | Freq: Four times a day (QID) | ORAL | Status: DC | PRN

## 2015-08-28 MED ORDER — ONDANSETRON HCL 4 MG PO TABS: 4.0000 mg | ORAL_TABLET | ORAL | Status: DC | PRN

## 2015-08-28 MED ORDER — SENNOSIDES-DOCUSATE SODIUM 8.6-50 MG PO TABS
2.0000 | ORAL_TABLET | ORAL | Status: DC
  Administered 2015-08-29 – 2015-08-30 (×2): 2 via ORAL
  Filled 2015-08-28 (×2): qty 2

## 2015-08-28 MED ORDER — LANOLIN HYDROUS EX OINT: TOPICAL_OINTMENT | CUTANEOUS | Status: DC | PRN

## 2015-08-28 MED ORDER — BENZOCAINE-MENTHOL 20-0.5 % EX AERO
1.0000 "application " | INHALATION_SPRAY | CUTANEOUS | Status: DC | PRN
  Administered 2015-08-29: 1 via TOPICAL
  Filled 2015-08-28: qty 56

## 2015-08-28 MED ORDER — WITCH HAZEL-GLYCERIN EX PADS: 1.0000 "application " | MEDICATED_PAD | CUTANEOUS | Status: DC | PRN

## 2015-08-28 NOTE — Progress Notes (Signed)
Late Entry Pt on pitocin 2x2 for approx 24 hrs.  Assessed several times and station to high for AROM.  Throughout baby has had Cat 1 reassuring strip.  Approx 5am, RN halfed pit and will increase again 2x2.  Slight change from 2cm to 2.5cm and 70 to 80% effaced  Head still -3 lass assessment.

## 2015-08-28 NOTE — Consults (Signed)
  Anesthesia Pain Consult Note  Patient: Judith Ruiz, 25 y.o., female  Consult Requested by: Philip Aspen, DO  Reason for Consult: CRNA Pain/Epidural Rounding.  Level of Consciousness: alert  Pain: Current: 1/10.  Pain Goal: 8/10.  Last Vitals:  Filed Vitals:   08/28/15 0730 08/28/15 0800  BP: 132/56 131/70  Pulse: 87 90  Temp: 37.1 C   Resp: 18 22    Plan: Epidural infusion for pain control.  Tysheem Accardo L 08/28/2015

## 2015-08-29 LAB — CBC
HCT: 25 % — ABNORMAL LOW (ref 36.0–46.0)
Hemoglobin: 7.9 g/dL — ABNORMAL LOW (ref 12.0–15.0)
MCH: 23.4 pg — AB (ref 26.0–34.0)
MCHC: 31.6 g/dL (ref 30.0–36.0)
MCV: 74 fL — AB (ref 78.0–100.0)
PLATELETS: 260 10*3/uL (ref 150–400)
RBC: 3.38 MIL/uL — AB (ref 3.87–5.11)
RDW: 15.9 % — ABNORMAL HIGH (ref 11.5–15.5)
WBC: 18 10*3/uL — ABNORMAL HIGH (ref 4.0–10.5)

## 2015-08-29 MED ORDER — INFLUENZA VAC SPLIT QUAD 0.5 ML IM SUSY: 0.5000 mL | PREFILLED_SYRINGE | INTRAMUSCULAR | Status: DC

## 2015-08-29 MED ORDER — RHO D IMMUNE GLOBULIN 1500 UNIT/2ML IJ SOSY
300.0000 ug | PREFILLED_SYRINGE | Freq: Once | INTRAMUSCULAR | Status: AC
Start: 2015-08-29 — End: 2015-08-29
  Administered 2015-08-29: 300 ug via INTRAVENOUS
  Filled 2015-08-29: qty 2

## 2015-08-29 NOTE — Lactation Note (Signed)
This note was copied from a baby's chart. Lactation Consultation Note New mom BF in football position sitting in recliner when entered room. Mom has tubular shaped breast w/short shaft everted nipples and compressible breast. Noted small space between breast w/black hair. Mom denies having PCOS. Hand expression demonstrated w/glistening of colostrum. Referred to Baby and Me Book in Breastfeeding section Pg. 22-23 for position options and Proper latch demonstration.Mom encouraged to feed baby 8-12 times/24 hours and with feeding cues. Educated about newborn behavior. WH/LC brochure given w/resources, support groups and LC services. Patient Name: Judith Ruiz Today's Date: 08/29/2015 Reason for consult: Initial assessment   Maternal Data Has patient been taught Hand Expression?: Yes Does the patient have breastfeeding experience prior to this delivery?: No  Feeding Feeding Type: Breast Fed Length of feed: 15 min  LATCH Score/Interventions Latch: Repeated attempts needed to sustain latch, nipple held in mouth throughout feeding, stimulation needed to elicit sucking reflex. Intervention(s): Assist with latch;Breast massage;Breast compression  Audible Swallowing: A few with stimulation Intervention(s): Hand expression;Alternate breast massage  Type of Nipple: Everted at rest and after stimulation  Comfort (Breast/Nipple): Soft / non-tender     Hold (Positioning): Assistance needed to correctly position infant at breast and maintain latch. Intervention(s): Skin to skin;Position options;Support Pillows;Breastfeeding basics reviewed  LATCH Score: 7  Lactation Tools Discussed/Used WIC Program: Yes   Consult Status Consult Status: Follow-up Date: 08/30/15 Follow-up type: In-patient    Charyl Dancer 08/29/2015, 6:28 AM

## 2015-08-29 NOTE — Lactation Note (Signed)
This note was copied from a baby's chart. Lactation Consultation Note  Patient Name: Judith Ruiz Date: 08/29/2015 Reason for consult: Follow-up assessment  With this mom and term baby, now 11 hours old. Mom reports breast feeding going well. The baby was awake and alert. Mom sat in chair, and fed in football hold. Mom had easily expressed colostrum. [name] latched deeply, after a minute of head bobbing, with strong suckles and visible swallows. Basic breast feeding teaching reviewed, on normal # wet and dirty diapers, feeding with cues, cluster feeding. Mom and dad very receptive to teaching, and know to call for questions/concerns.    Maternal Data    Feeding Feeding Type: Breast Fed  LATCH Score/Interventions Latch: Grasps breast easily, tongue down, lips flanged, rhythmical sucking. Intervention(s): Adjust position;Assist with latch  Audible Swallowing: A few with stimulation Intervention(s): Skin to skin;Hand expression  Type of Nipple: Everted at rest and after stimulation  Comfort (Breast/Nipple): Soft / non-tender     Hold (Positioning): Assistance needed to correctly position infant at breast and maintain latch. Intervention(s): Breastfeeding basics reviewed;Support Pillows;Position options;Skin to skin  LATCH Score: 8  Lactation Tools Discussed/Used     Consult Status Consult Status: Follow-up Date: 08/30/15 Follow-up type: In-patient    Alfred Levins 08/29/2015, 1:15 PM

## 2015-08-29 NOTE — Progress Notes (Signed)
PPD#1 Pt has mild cough which she had prior to admission. No myalgias. WBC this am is 17K down from 22 K *pm last night. Lochia -mild, HGB-7.9 Tmax 100.4 AF this am.  IMP/Stable Plan/ Routine care

## 2015-08-29 NOTE — Anesthesia Postprocedure Evaluation (Signed)
Anesthesia Post Note  Patient: Judith Ruiz  Procedure(s) Performed: * No procedures listed *  Patient location during evaluation: Mother Baby Anesthesia Type: Epidural Level of consciousness: awake and alert Pain management: pain level controlled Vital Signs Assessment: post-procedure vital signs reviewed and stable Respiratory status: spontaneous breathing, nonlabored ventilation and respiratory function stable Cardiovascular status: stable Postop Assessment: no headache, no backache and epidural receding Anesthetic complications: no    Last Vitals:  Filed Vitals:   08/28/15 2220 08/29/15 0245  BP: 117/65 134/64  Pulse: 121 108  Temp: 37.7 C 37 C  Resp: 20 20    Last Pain:  Filed Vitals:   08/29/15 0634  PainSc: 0-No pain                 Inioluwa Baris

## 2015-08-30 LAB — RH IG WORKUP (INCLUDES ABO/RH)
ABO/RH(D): O NEG
Fetal Screen: NEGATIVE
Gestational Age(Wks): 39
Unit division: 0

## 2015-08-30 NOTE — Discharge Summary (Addendum)
Obstetric Discharge Summary Reason for Admission: induction Prenatal Procedures: none Intrapartum Procedures: vacuum Postpartum Procedures: Rho(D) Ig Complications-Operative and Postpartum: 2 degree perineal laceration HEMOGLOBIN  Date Value Ref Range Status  08/29/2015 7.9* 12.0 - 15.0 g/dL Final   HCT  Date Value Ref Range Status  08/29/2015 25.0* 36.0 - 46.0 % Final    Discharge Diagnoses: Term Pregnancy-delivered  Discharge Information: Date: 08/30/2015 Activity: pelvic rest Diet: routine Medications: Ibuprofen and Iron Condition: stable Instructions: refer to practice specific booklet Discharge to: home Follow-up Information    Follow up with Almon Hercules., MD In 4 weeks.   Specialty:  Obstetrics and Gynecology   Contact information:   [address] [place] [address] [phone]       Newborn Data: Live born female  Birth Weight: 7 lb 0.6 oz (3192 g) APGAR: 8, 9  Home with mother.  Kaiyah Eber A 08/30/2015, 7:34 AM

## 2015-08-30 NOTE — Lactation Note (Signed)
This note was copied from a baby's chart. Lactation Consultation Note  Baby latched in laid back cradle upon entering.   Sucks and a few swallows observed.  Mother denies problems or questions. Encouraged mother to compress/massage breast during feeding. Reviewed engorgement care and monitoring voids/stools. Provided mother with manual breast pump and reviewed use.  Patient Name: Judith Ruiz [id number] Date: 08/30/2015 Reason for consult: Follow-up assessment   Maternal Data    Feeding Feeding Type: Breast Fed Length of feed: 45 min  LATCH Score/Interventions Latch: Grasps breast easily, tongue down, lips flanged, rhythmical sucking.  Audible Swallowing: A few with stimulation Intervention(s): Alternate breast massage  Type of Nipple: Everted at rest and after stimulation  Comfort (Breast/Nipple): Soft / non-tender     Hold (Positioning): Assistance needed to correctly position infant at breast and maintain latch. Intervention(s): Breastfeeding basics reviewed;Support Pillows  LATCH Score: 8  Lactation Tools Discussed/Used     Consult Status Consult Status: Complete    Hardie Pulley 08/30/2015, 10:08 AM

## 2015-08-30 NOTE — Progress Notes (Signed)
Patient is eating, ambulating, voiding.  Pain control is good.  Filed Vitals:   08/29/15 0245 08/29/15 0820 08/29/15 1813 08/30/15 0634  BP: 134/64 121/70 128/80 112/53  Pulse: 108 99 105 103  Temp: 98.6 F (37 C) 97.8 F (36.6 C) 98.2 F (36.8 C) 98.4 F (36.9 C)  TempSrc: Oral Oral Oral Oral  Resp: Height:      Weight:      SpO2:  98%  97%    Fundus firm Perineum without swelling.  Lab Results  Component Value Date   WBC 18.0* 08/29/2015   HGB 7.9* 08/29/2015   HCT 25.0* 08/29/2015   MCV 74.0* 08/29/2015   PLT 260 08/29/2015    --/--/O NEG (03/01 0536)/RI  A/P Post partum day 2.  Routine care.  Expect d/c today.  Baby O+- pt got rhogam.  Rachard Isidro A

## 2019-07-15 DIAGNOSIS — N926 Irregular menstruation, unspecified: Secondary | ICD-10-CM | POA: Diagnosis not present

## 2019-07-15 DIAGNOSIS — Z01419 Encounter for gynecological examination (general) (routine) without abnormal findings: Secondary | ICD-10-CM | POA: Diagnosis not present

## 2019-07-15 DIAGNOSIS — Z6841 Body Mass Index (BMI) 40.0 and over, adult: Secondary | ICD-10-CM | POA: Diagnosis not present

## 2019-07-21 DIAGNOSIS — N926 Irregular menstruation, unspecified: Secondary | ICD-10-CM | POA: Diagnosis not present

## 2021-05-17 ENCOUNTER — Emergency Department (HOSPITAL_COMMUNITY): Payer: BC Managed Care – PPO

## 2021-05-17 ENCOUNTER — Emergency Department (HOSPITAL_COMMUNITY)
Admission: EM | Admit: 2021-05-17 | Discharge: 2021-05-17 | Disposition: A | Discharge status: Home / Self Care | Payer: BC Managed Care – PPO | Attending: Emergency Medicine | Admitting: Emergency Medicine

## 2021-05-17 ENCOUNTER — Other Ambulatory Visit: Payer: Self-pay

## 2021-05-17 DIAGNOSIS — R079 Chest pain, unspecified: Secondary | ICD-10-CM | POA: Diagnosis not present

## 2021-05-17 DIAGNOSIS — R059 Cough, unspecified: Secondary | ICD-10-CM | POA: Diagnosis not present

## 2021-05-17 DIAGNOSIS — N9489 Other specified conditions associated with female genital organs and menstrual cycle: Secondary | ICD-10-CM | POA: Diagnosis not present

## 2021-05-17 DIAGNOSIS — R000 Tachycardia, unspecified: Secondary | ICD-10-CM | POA: Insufficient documentation

## 2021-05-17 DIAGNOSIS — R0789 Other chest pain: Secondary | ICD-10-CM | POA: Diagnosis not present

## 2021-05-17 DIAGNOSIS — Z20822 Contact with and (suspected) exposure to covid-19: Secondary | ICD-10-CM | POA: Insufficient documentation

## 2021-05-17 DIAGNOSIS — R0602 Shortness of breath: Secondary | ICD-10-CM | POA: Diagnosis not present

## 2021-05-17 LAB — BASIC METABOLIC PANEL
Anion gap: 7 (ref 5–15)
BUN: 8 mg/dL (ref 6–20)
CO2: 24 mmol/L (ref 22–32)
Calcium: 8.9 mg/dL (ref 8.9–10.3)
Chloride: 106 mmol/L (ref 98–111)
Creatinine, Ser: 0.72 mg/dL (ref 0.44–1.00)
GFR, Estimated: >60 mL/min (ref >60)
Glucose, Bld: 98 mg/dL (ref 70–99)
Potassium: 4 mmol/L (ref 3.5–5.1)
Sodium: 137 mmol/L (ref 135–145)

## 2021-05-17 LAB — CBC
HCT: 38 % (ref 36.0–46.0)
Hemoglobin: 11.3 g/dL — ABNORMAL LOW (ref 12.0–15.0)
MCH: 21.9 pg — ABNORMAL LOW (ref 26.0–34.0)
MCHC: 29.7 g/dL — ABNORMAL LOW (ref 30.0–36.0)
MCV: 73.5 fL — ABNORMAL LOW (ref 80.0–100.0)
Platelets: 363 10*3/uL (ref 150–400)
RBC: 5.17 MIL/uL — ABNORMAL HIGH (ref 3.87–5.11)
RDW: 16.8 % — ABNORMAL HIGH (ref 11.5–15.5)
WBC: 8.9 10*3/uL (ref 4.0–10.5)
nRBC: 0 % (ref 0.0–0.2)

## 2021-05-17 LAB — TROPONIN I (HIGH SENSITIVITY)
Troponin I (High Sensitivity): 5 ng/L (ref <18)
Troponin I (High Sensitivity): 3 ng/L (ref <18)

## 2021-05-17 LAB — RESP PANEL BY RT-PCR (FLU A&B, COVID) ARPGX2
Influenza A by PCR: NEGATIVE
Influenza B by PCR: NEGATIVE
SARS Coronavirus 2 by RT PCR: NEGATIVE

## 2021-05-17 LAB — I-STAT BETA HCG BLOOD, ED (MC, WL, AP ONLY): I-stat hCG, quantitative: <5 m[IU]/mL (ref <5)

## 2021-05-17 MED ORDER — ALUM & MAG HYDROXIDE-SIMETH 200-200-20 MG/5ML PO SUSP
30.0000 mL | Freq: Once | ORAL | Status: AC
  Administered 2021-05-17: 30 mL via ORAL
  Filled 2021-05-17: qty 30

## 2021-05-17 MED ORDER — LIDOCAINE VISCOUS HCL 2 % MT SOLN
15.0000 mL | Freq: Once | OROMUCOSAL | Status: AC
  Administered 2021-05-17: 15 mL via ORAL
  Filled 2021-05-17: qty 15

## 2021-05-17 MED ORDER — IBUPROFEN 800 MG PO TABS
800.0000 mg | ORAL_TABLET | Freq: Once | ORAL | Status: AC
  Administered 2021-05-17: 800 mg via ORAL
  Filled 2021-05-17: qty 1

## 2021-05-17 NOTE — ED Triage Notes (Signed)
Pt. Stated, Judith Ruiz had some chest pain off and on for 5 days. Sometimes hard to breath.

## 2021-05-17 NOTE — ED Notes (Signed)
Discharge instructions reviewed with patient . Patient verbalized understanding of instructions. Follow-up care was reviewed. Patient ambulatory with steady gait. VSS upon discharge.  

## 2021-05-17 NOTE — Discharge Instructions (Addendum)
You were evaluated in the Emergency Department and after careful evaluation, we did not find any emergent condition requiring admission or further testing in the hospital.  Your work-up today was overall reassuring.  As discussed, your lab work did not show any signs of a heart attack, we have low suspicion that you have a blood clot in your lungs.  COVID and flu test results will take several hours to result, those will be available via the MyChart app.  There are instructions on your discharge paperwork in order to download this.  If this is positive, treat with ibuprofen, Tylenol, over-the-counter cold and flu medicines, drinking plenty of water and quarantining.  Chest pain can be caused by many things including stress, anxiety, reflux.  You may take ibuprofen for pain as well as over-the-counter reflux medicines and see if this helps.  If your symptoms continue, please establish with a primary care doctor.  In order to do this, please call the phone number in your discharge paperwork in order to establish with one.  Return to the ER for any new or worsening symptoms.   Thank you for allowing [place] to be a part of your care.

## 2021-05-17 NOTE — ED Provider Notes (Signed)
[facility] EMERGENCY DEPARTMENT Provider Note   CSN: [id number] Arrival date & time: 05/17/21  [id number]     History Chief Complaint  Patient presents with   Chest Pain    Judith Ruiz is a 30 y.o. female.  HPI 30 year old female with no significant medical history presents to the ER with complaints of intermittent chest pain which has been ongoing for the last 5 days.  Patient describes it as sharp and stabbing.  She has not identified a pattern to it, patient states that the pain comes and goes throughout the day.  She does not think this is brought on by eating.  She denies any shortness of breath, but though did state that she developed a cough today.  Denies any fevers.  No pleuritic symptoms.  No leg swelling, not on OCPs.  She has not had episodes like this before.  She denies any stressors or anxiety in her life.  She denies any dizziness, syncope.    Past Medical History:  Diagnosis Date   Medical history non-contributory     Patient Active Problem List   Diagnosis Date Noted   Spontaneous vaginal delivery 08/28/2015   Normal labor 08/27/2015   Obesity 01/04/2015    Past Surgical History:  Procedure Laterality Date   NO PAST SURGERIES       OB History     Gravida  1   Para  1   Term  1   Preterm      AB      Living  1      SAB      IAB      Ectopic      Multiple  0   Live Births  1           Family History  Problem Relation Age of Onset   Hyperlipidemia Father     Social History   Tobacco Use   Smoking status: Never   Smokeless tobacco: Never  Substance Use Topics   Alcohol use: No    Alcohol/week: 0.0 standard drinks   Drug use: No    Home Medications Prior to Admission medications   Not on File    Allergies    Patient has no known allergies.  Review of Systems   Review of Systems Ten systems reviewed and are negative for acute change, except as noted in the HPI.   Physical  Exam Updated Vital Signs BP (!) 106/54 (BP Location: Right Arm)   Pulse 85   Temp 98.9 F (37.2 C) (Oral)   Resp 16   LMP 05/09/2021 (Approximate)   SpO2 100%   Physical Exam Vitals and nursing note reviewed.  Constitutional:      General: She is not in acute distress.    Appearance: She is well-developed.  HENT:     Head: Normocephalic and atraumatic.  Eyes:     Conjunctiva/sclera: Conjunctivae normal.  Cardiovascular:     Rate and Rhythm: Normal rate and regular rhythm.     Heart sounds: No murmur heard.    Comments: No reproducible chest wall tenderness Pulmonary:     Effort: Pulmonary effort is normal. No respiratory distress.     Breath sounds: Normal breath sounds. No decreased breath sounds, wheezing, rhonchi or rales.  Abdominal:     Palpations: Abdomen is soft.     Tenderness: There is no abdominal tenderness.  Musculoskeletal:        General: No swelling.  Cervical back: Neck supple.     Right lower leg: No edema.     Left lower leg: No edema.  Skin:    General: Skin is warm and dry.     Capillary Refill: Capillary refill takes less than 2 seconds.  Neurological:     Mental Status: She is alert.  Psychiatric:        Mood and Affect: Mood normal.    ED Results / Procedures / Treatments   Labs (all labs ordered are listed, but only abnormal results are displayed) Labs Reviewed  CBC - Abnormal; Notable for the following components:      Result Value   RBC 5.17 (*)    Hemoglobin 11.3 (*)    MCV 73.5 (*)    MCH 21.9 (*)    MCHC 29.7 (*)    RDW 16.8 (*)    All other components within normal limits  RESP PANEL BY RT-PCR (FLU A&B, COVID) [id number]  BASIC METABOLIC PANEL  I-STAT BETA HCG BLOOD, ED (MC, [facility], AP ONLY)  TROPONIN I (HIGH SENSITIVITY)  TROPONIN I (HIGH SENSITIVITY)    EKG None  Radiology DG Chest 2 View  Result Date: 05/17/2021 CLINICAL DATA:  Chest pain and shortness of breath EXAM: CHEST - 2 VIEW COMPARISON:  None. FINDINGS: The  heart size and mediastinal contours are within normal limits. Both lungs are clear. The visualized skeletal structures are unremarkable. IMPRESSION: No active cardiopulmonary disease. Electronically Signed   By: Jerilynn Mages.  Shick M.D.   On: 05/17/2021 08:38    Procedures Procedures   Medications Ordered in ED Medications  alum & mag hydroxide-simeth (MAALOX/MYLANTA) 200-200-20 MG/5ML suspension 30 mL (has no administration in time range)    And  lidocaine (XYLOCAINE) 2 % viscous mouth solution 15 mL (has no administration in time range)  ibuprofen (ADVIL) tablet 800 mg (has no administration in time range)    ED Course  I have reviewed the triage vital signs and the nursing notes.  Pertinent labs & imaging results that were available during my care of the patient were reviewed by me and considered in my medical decision making (see chart for details).    MDM Rules/Calculators/A&P                           30 year old female with complaints of intermittent chest pain over the last 5 days.  On arrival, she is well-appearing, no acute distress, resting comfortably in the ER bed.  On my exam, she is not tachycardic, tachypneic or hypoxic.  She was mildly tachycardic on arrival to the ED with a rate of 102 however this has improved.  Physical exam unremarkable, no lower extremity edema, lung sounds clear, patient speaking full sentences without increased work of breathing.  No reproducible chest wall tenderness.  Labs overall reassuring, CBC with no leukocytosis, hemoglobin of 11.3 which appears to be stable.  BMP without any significant electrode abnormalities.  Delta troponins are negative.  EKG without ischemic changes.  Chest x-ray unremarkable.  Given GI cocktail, ibuprofen, add on COVID and flu test.  Overall work-up reassuring.  Low suspicion for PE, ACS, dissection, pericarditis, myocarditis.  Exam findings not consistent with costochondritis.  Patient encouraged to follow-up on COVID and flu  results via [place].  Encouraged PCP follow-up if her symptoms continue.  We discussed return precautions.  She voiced understanding and is agreeable.  Stable for discharge. Final Clinical Impression(s) / ED Diagnoses Final diagnoses:  Chest  pain, unspecified type    Rx / DC Orders ED Discharge Orders     None        Leone Brand 05/17/21 1350    Arby Barrette, MD 05/17/21 1559

## 2021-05-27 DIAGNOSIS — R072 Precordial pain: Secondary | ICD-10-CM | POA: Diagnosis not present

## 2021-05-27 DIAGNOSIS — R12 Heartburn: Secondary | ICD-10-CM | POA: Diagnosis not present

## 2021-05-27 DIAGNOSIS — R0982 Postnasal drip: Secondary | ICD-10-CM | POA: Diagnosis not present

## 2021-05-27 DIAGNOSIS — F430 Acute stress reaction: Secondary | ICD-10-CM | POA: Diagnosis not present

## 2021-06-21 ENCOUNTER — Encounter (HOSPITAL_COMMUNITY): Payer: Self-pay | Admitting: Internal Medicine

## 2021-06-21 ENCOUNTER — Observation Stay (HOSPITAL_COMMUNITY)
Admission: EM | Admit: 2021-06-21 | Discharge: 2021-06-22 | Disposition: A | Discharge status: Home / Self Care | Payer: BC Managed Care – PPO | Attending: Family Medicine | Admitting: Family Medicine

## 2021-06-21 ENCOUNTER — Emergency Department (HOSPITAL_COMMUNITY): Payer: BC Managed Care – PPO

## 2021-06-21 ENCOUNTER — Other Ambulatory Visit: Payer: Self-pay

## 2021-06-21 DIAGNOSIS — D509 Iron deficiency anemia, unspecified: Secondary | ICD-10-CM

## 2021-06-21 DIAGNOSIS — R0789 Other chest pain: Secondary | ICD-10-CM | POA: Diagnosis not present

## 2021-06-21 DIAGNOSIS — R0602 Shortness of breath: Secondary | ICD-10-CM | POA: Diagnosis not present

## 2021-06-21 DIAGNOSIS — R072 Precordial pain: Secondary | ICD-10-CM

## 2021-06-21 DIAGNOSIS — R079 Chest pain, unspecified: Secondary | ICD-10-CM | POA: Diagnosis present

## 2021-06-21 DIAGNOSIS — E876 Hypokalemia: Secondary | ICD-10-CM

## 2021-06-21 DIAGNOSIS — Z20822 Contact with and (suspected) exposure to covid-19: Secondary | ICD-10-CM | POA: Insufficient documentation

## 2021-06-21 DIAGNOSIS — R7989 Other specified abnormal findings of blood chemistry: Secondary | ICD-10-CM

## 2021-06-21 DIAGNOSIS — O99210 Obesity complicating pregnancy, unspecified trimester: Secondary | ICD-10-CM | POA: Diagnosis present

## 2021-06-21 DIAGNOSIS — R9431 Abnormal electrocardiogram [ECG] [EKG]: Secondary | ICD-10-CM | POA: Diagnosis not present

## 2021-06-21 DIAGNOSIS — E669 Obesity, unspecified: Secondary | ICD-10-CM | POA: Diagnosis present

## 2021-06-21 DIAGNOSIS — R778 Other specified abnormalities of plasma proteins: Secondary | ICD-10-CM | POA: Diagnosis not present

## 2021-06-21 HISTORY — DX: Obesity, unspecified: E66.9

## 2021-06-21 LAB — CBC
HCT: 37 % (ref 36.0–46.0)
Hemoglobin: 11.4 g/dL — ABNORMAL LOW (ref 12.0–15.0)
MCH: 22.4 pg — ABNORMAL LOW (ref 26.0–34.0)
MCHC: 30.8 g/dL (ref 30.0–36.0)
MCV: 72.5 fL — ABNORMAL LOW (ref 80.0–100.0)
Platelets: 341 10*3/uL (ref 150–400)
RBC: 5.1 MIL/uL (ref 3.87–5.11)
RDW: 16.6 % — ABNORMAL HIGH (ref 11.5–15.5)
WBC: 6.6 10*3/uL (ref 4.0–10.5)
nRBC: 0 % (ref 0.0–0.2)

## 2021-06-21 LAB — BASIC METABOLIC PANEL
Anion gap: 9 (ref 5–15)
BUN: 13 mg/dL (ref 6–20)
CO2: 23 mmol/L (ref 22–32)
Calcium: 9 mg/dL (ref 8.9–10.3)
Chloride: 105 mmol/L (ref 98–111)
Creatinine, Ser: 0.79 mg/dL (ref 0.44–1.00)
GFR, Estimated: >60 mL/min (ref >60)
Glucose, Bld: 94 mg/dL (ref 70–99)
Potassium: 3.3 mmol/L — ABNORMAL LOW (ref 3.5–5.1)
Sodium: 137 mmol/L (ref 135–145)

## 2021-06-21 LAB — TROPONIN I (HIGH SENSITIVITY)
Troponin I (High Sensitivity): 97 ng/L — ABNORMAL HIGH (ref <18)
Troponin I (High Sensitivity): 91 ng/L — ABNORMAL HIGH (ref <18)

## 2021-06-21 LAB — I-STAT BETA HCG BLOOD, ED (MC, WL, AP ONLY): I-stat hCG, quantitative: <5 m[IU]/mL (ref <5)

## 2021-06-21 LAB — RESP PANEL BY RT-PCR (FLU A&B, COVID) ARPGX2
Influenza A by PCR: NEGATIVE
Influenza B by PCR: NEGATIVE
SARS Coronavirus 2 by RT PCR: NEGATIVE

## 2021-06-21 LAB — MAGNESIUM: Magnesium: 2.2 mg/dL (ref 1.7–2.4)

## 2021-06-21 MED ORDER — POTASSIUM CHLORIDE CRYS ER 20 MEQ PO TBCR
40.0000 meq | EXTENDED_RELEASE_TABLET | Freq: Once | ORAL | Status: AC
  Administered 2021-06-21: 22:00:00 40 meq via ORAL
  Filled 2021-06-21: qty 2

## 2021-06-21 MED ORDER — ASPIRIN 325 MG PO TABS
325.0000 mg | ORAL_TABLET | Freq: Every day | ORAL | Status: DC
  Administered 2021-06-21: 20:00:00 325 mg via ORAL
  Filled 2021-06-21: qty 1

## 2021-06-21 MED ORDER — ACETAMINOPHEN 325 MG PO TABS: 650.0000 mg | ORAL_TABLET | ORAL | Status: DC | PRN

## 2021-06-21 MED ORDER — ENOXAPARIN SODIUM 40 MG/0.4ML IJ SOSY
40.0000 mg | PREFILLED_SYRINGE | INTRAMUSCULAR | Status: DC
  Administered 2021-06-21: 22:00:00 40 mg via SUBCUTANEOUS
  Filled 2021-06-21: qty 0.4

## 2021-06-21 MED ORDER — IOHEXOL 350 MG/ML SOLN
100.0000 mL | Freq: Once | INTRAVENOUS | Status: AC | PRN
  Administered 2021-06-21: 18:00:00 100 mL via INTRAVENOUS

## 2021-06-21 NOTE — ED Provider Notes (Signed)
Emergency Medicine Provider Triage Evaluation Note  Judith Ruiz , a 30 y.o. female  was evaluated in triage.  Pt complains of chest pain and shortness of breath.  Chest pain has been intermittent for the past month.  Shortness of breath over the past 4 days.  She denies fever, chills, abdominal pain, nausea, lightheadedness, or diaphoresis.   Review of Systems  Positive: As above Negative: As above  Physical Exam  BP (!) 156/97 (BP Location: Right Arm)    Pulse 94    Temp 98.8 F (37.1 C) (Oral)    Resp 16    LMP 06/14/2021    SpO2 98%  Gen:   Awake, no distress   Resp:  Normal effort  MSK:   Moves extremities without difficulty  Other:    Medical Decision Making  Medically screening exam initiated at 9:36 AM.  Appropriate orders placed.  Judith Ruiz was informed that the remainder of the evaluation will be completed by another provider, this initial triage assessment does not replace that evaluation, and the importance of remaining in the ED until their evaluation is complete.     Marita [place], PA-C 06/21/21 5364    Gerhard Munch, MD 06/21/21 [phone]

## 2021-06-21 NOTE — ED Notes (Signed)
Patient transported to CT 

## 2021-06-21 NOTE — Consult Note (Addendum)
Cardiology Consult    Patient ID: Judith Ruiz MRN: [id number], DOB/AGE: September 29, 1990   Admit date: 06/21/2021 Date of Consult: 06/21/2021  Primary Physician: Patient, No Pcp Per (Inactive) Primary Cardiologist: Sanda Klein, MD Requesting Provider: Ninetta Lights, MD  Patient Profile    Judith Ruiz is a 30 y.o. female with a history of obesity, who is being seen today for the evaluation of chest pain w/ abnl ECG and abnl HsTrop at the request of Dr. Marlowe Sax.  Past Medical History   Past Medical History:  Diagnosis Date   Obesity     Past Surgical History:  Procedure Laterality Date   NO PAST SURGERIES       Allergies  No Known Allergies  History of Present Illness    30 y/o ? w/ a h/o obesity.  She lives locally w/ her husband and 37 y/o dtr.  She does not routinely exercise.  Beginning about 1 week prior to Thanksgiving this year, she began to experience intermittent, sharp 7-8 over 10 chest pain radiating to her mid scapular area, sometimes associated with dyspnea or lightheadedness, occurring randomly several times per day, lasting 3 to 5 minutes, and resolving spontaneously.  She was seen in the emergency department on November 18, at which time troponins were normal and ECG unremarkable.  She was discharged home.  Unfortunately, she continued to have intermittent stabbing type chest pain a few times per week.  Symptoms did not necessarily limit her activity.  Over the past week, she has been having similar sharp chest and mid scapular pain on a daily basis.  On the evening of December 22, she got up to use the bathroom and noted a pounding sensation in her chest.  This was associated with mild dyspnea and lightheadedness.  She denies tachypalpitations but noted "hard heartbeats."  She was eventually able to fall asleep after 5 to 10 minutes of symptoms but upon awakening this morning, she noted recurrence of "hard heartbeats," prompting her to present to  the emergency department.  Here, ECG showed subtle, less than 1 mm inferolateral ST depression.  Troponins, which were normal in November, returned abnormal at 99 with a subsequent value of 91.  CT of the chest was performed and was negative for PE or other chest related pathology.  Respiratory panel is pending.  Patient is currently symptom-free.  Inpatient Medications     aspirin  325 mg Oral Daily    Family History    Family History  Problem Relation Age of Onset   Hyperlipidemia Father    She indicated that her mother is alive. She indicated that her father is alive. She indicated that both of her sisters are alive. She indicated that her brother is alive.   Social History    Social History   Socioeconomic History   Marital status: Married    Spouse name: Not on file   Number of children: Not on file   Years of education: Not on file   Highest education level: Not on file  Occupational History   Not on file  Tobacco Use   Smoking status: Never   Smokeless tobacco: Never  Substance and Sexual Activity   Alcohol use: No    Alcohol/week: 0.0 standard drinks   Drug use: No   Sexual activity: Yes    Birth control/protection: None  Other Topics Concern   Not on file  Social History Narrative   Lives locally w/ husband and 15 y/o dtr.  Works in  an office - sedentary.   Social Determinants of Health   Financial Resource Strain: Not on file  Food Insecurity: Not on file  Transportation Needs: Not on file  Physical Activity: Not on file  Stress: Not on file  Social Connections: Not on file  Intimate Partner Violence: Not on file     Review of Systems    General:  No chills, fever, night sweats or weight changes.  Cardiovascular:  +++ Sharp chest and mid scapular back pain occasional associated dyspnea or lightheadedness.  She denies dyspnea on exertion, edema, orthopnea, palpitations, paroxysmal nocturnal dyspnea. Dermatological: No rash, lesions/masses Respiratory:  No cough, +++ dyspnea Urologic: No hematuria, dysuria Abdominal:   No nausea, vomiting, diarrhea, bright red blood per rectum, melena, or hematemesis Neurologic:  No visual changes, wkns, changes in mental status. All other systems reviewed and are otherwise negative except as noted above.  Physical Exam    Blood pressure 125/67, pulse 91, temperature 98.5 F (36.9 C), temperature source Oral, resp. rate 13, last menstrual period 06/14/2021, SpO2 99 %, unknown if currently breastfeeding.  General: Pleasant, NAD Psych: Normal affect. Neuro: Alert and oriented X 3. Moves all extremities spontaneously. HEENT: Normal  Neck: Supple without bruits or JVD. Lungs:  Resp regular and unlabored, CTA. Heart: RRR no s3, s4, or murmurs. Abdomen: Soft, non-tender, non-distended, BS + x 4.  Extremities: No clubbing, cyanosis or edema. DP/PT2+, Radials 2+ and equal bilaterally.  Labs    Cardiac Enzymes Recent Labs  Lab 06/21/21 1013 06/21/21 1138  TROPONINIHS 97* 91*      Lab Results  Component Value Date   WBC 6.6 06/21/2021   HGB 11.4 (L) 06/21/2021   HCT 37.0 06/21/2021   MCV 72.5 (L) 06/21/2021   PLT 341 06/21/2021    Recent Labs  Lab 06/21/21 1013  NA 137  K 3.3*  CL 105  CO2 23  BUN 13  CREATININE 0.79  CALCIUM 9.0  GLUCOSE 94     Radiology Studies    DG Chest 2 View  Result Date: 06/21/2021 CLINICAL DATA:  30 year old female with chest pain and shortness of breath. EXAM: CHEST - 2 VIEW COMPARISON:  05/17/2021. FINDINGS: Lung volumes and mediastinal contours remain normal. Visualized tracheal air column is within normal limits. Lung markings are stable and within normal limits. Both lungs appear clear. Abdomen is shielded. No acute osseous abnormality identified. IMPRESSION: Negative.  No cardiopulmonary abnormality. Electronically Signed   By: Genevie Ann M.D.   On: 06/21/2021 10:11   CT Angio Chest PE W and/or Wo Contrast  Result Date: 06/21/2021 CLINICAL DATA:  Chest  pain EXAM: CT ANGIOGRAPHY CHEST WITH CONTRAST TECHNIQUE: Multidetector CT imaging of the chest was performed using the standard protocol during bolus administration of intravenous contrast. Multiplanar CT image reconstructions and MIPs were obtained to evaluate the vascular anatomy. CONTRAST:  179mL OMNIPAQUE IOHEXOL 350 MG/ML SOLN COMPARISON:  Chest x-ray 06/21/2021 FINDINGS: Cardiovascular: Satisfactory opacification of the pulmonary arteries to the segmental level. No evidence of pulmonary embolism. Normal heart size. No pericardial effusion. Nonaneurysmal aorta. Mediastinum/Nodes: No enlarged mediastinal, hilar, or axillary lymph nodes. Thyroid gland, trachea, and esophagus demonstrate no significant findings. Lungs/Pleura: Lungs are clear. No pleural effusion or pneumothorax. Upper Abdomen: No acute abnormality. Musculoskeletal: No chest wall abnormality. No acute or significant osseous findings. Review of the MIP images confirms the above findings. IMPRESSION: Negative for acute pulmonary embolus.  Clear lung fields. Electronically Signed   By: Donavan Foil M.D.   On:  06/21/2021 18:31    ECG & Cardiac Imaging    Regular sinus rhythm, 97, PVC, less than 1 mm inferolateral ST segment depression- personally reviewed.  Assessment & Plan    1.  Chest pain/abnormal ECG/elevated troponin: Patient with a history of sharp chest pain dating back to mid November, at which time she was seen in the emergency department with normal troponins and unremarkable ECG.  She was discharged home.  She continued to have intermittent sharp chest pain which is increased in frequency over the past week.  Beginning last night, she started experiencing a more throbbing-like sensation in her chest described as "hard heartbeats," associated mild dyspnea and lightheadedness.  She had recurrent symptoms this morning which prompted ED evaluation.  Here, ECG shows slight inferolateral ST segment depression, which was not present in  November.  Further, troponin is elevated at 97 with a follow-up value of 91.  Currently, she is symptom-free.  In light of symptoms with abnormal troponin and ECG, recommend admission.  We will order an echocardiogram to evaluate LV function and rule out wall motion abnormalities.  Provided that echo looks okay and troponins do not rise further, she can likely be discharged home tomorrow but we would arrange for outpatient follow-up and coronary CTA to rule out obstructive coronary artery disease.  2.  Hypokalemia: Potassium 3.3.  Supplement.  Signed, Nicolasa Ducking, NP 06/21/2021, 7:59 PM  For questions or updates, please contact   Please consult [email or web address] for contact info under Cardiology/STEMI.    I have seen and examined the patient along with Nicolasa Ducking, NP.  I have reviewed the chart, notes and new data.  I agree with PA/NP's note.  Key new complaints: waxing and waning sharp interscapular pain, without clear positional/exertional or pleuritic pattern Key examination changes: no pericardial rub, RRR, no murmurs Key new findings / data: new ECG changes with inferolateral ST depression and T wave inversion  and mild elevation in hsTroponin I. No calcified plaque in coronary or aortic distribution on CTA.   PLAN: Recommend echocardiogram for possible acute (myo)pericarditis. If normal, would pursue an outpatient coronary CT angio.  Thurmon Fair, MD, [facility] [phone] 06/21/2021, 8:22 PM

## 2021-06-21 NOTE — H&P (Signed)
History and Physical    Judith Ruiz [id number] DOB: 24-Dec-1990 DOA: 06/21/2021  PCP: Patient, No Pcp Per (Inactive) Patient coming from: Home  Chief Complaint: Chest pain  HPI: Judith Ruiz is a 30 y.o. female with medical history significant of obesity presented to the ED complaining of chest pain and shortness of breath.  Hemodynamically stable.  Labs showing no leukocytosis.  Hemoglobin 11.4, stable.  Potassium 3.3.  EKG showing borderline T wave abnormalities in inferior and lateral leads.  High-sensitivity troponin elevated but stable (97 > 91).  Troponin was normal on labs done last month.  CT angiogram chest negative for PE and showing clear lung fields. Patient was given aspirin 325 mg.  ED physician discussed the case with on-call cardiologist who recommended obtaining echocardiogram.  Patient states she was seen in the emergency room 1 month ago for chest pain and was told that all her tests were normal and that her pain was due to either acid reflux or stress.  States since then she has continued to have intermittent episodes of substernal sharp chest pain both with exertion and at rest.  Last night while walking in her house she experienced the same chest pain again.  She also felt short of breath, lightheaded, and felt that her heart was "not racing but beating strong."  She also felt "flutter" in her throat.  No associated diaphoresis or nausea.  Sometimes she feels slight burning sensation in her chest but has not experienced this since her chest pain started yesterday.  Chest pain is slightly worse when taking deep breaths.  Not exacerbated or relieved by any changes in her position.  She continued to have chest pain today which prompted her to come into the emergency room to be evaluated.  Currently chest pain-free.  Reports family history of coronary artery disease (paternal grandparents).  Denies personal history of any medical problems.  Review of  Systems:  All systems reviewed and apart from history of presenting illness, are negative.  Past Medical History:  Diagnosis Date   Medical history non-contributory     Past Surgical History:  Procedure Laterality Date   NO PAST SURGERIES       reports that she has never smoked. She has never used smokeless tobacco. She reports that she does not drink alcohol and does not use drugs.  No Known Allergies  Family History  Problem Relation Age of Onset   Hyperlipidemia Father     Prior to Admission medications   Medication Sig Start Date End Date Taking? Authorizing Provider  acetaminophen (TYLENOL) 500 MG tablet Take 500 mg by mouth every 6 (six) hours as needed for moderate pain or mild pain.   Yes [provider]  ibuprofen (ADVIL) 200 MG tablet Take 600 mg by mouth every 6 (six) hours as needed for mild pain or moderate pain.   Yes [provider]    Physical Exam: Vitals:   06/21/21 1513 06/21/21 1645 06/21/21 1730 06/21/21 1830  BP: 128/81 131/79 124/76 125/67  Pulse: 86 91 75 91  Resp: 18 15 14 13   Temp:      TempSrc:      SpO2: 99% 98% 100% 99%    Physical Exam Constitutional:      General: She is not in acute distress. HENT:     Head: Normocephalic and atraumatic.  Eyes:     Extraocular Movements: Extraocular movements intact.     Conjunctiva/sclera: Conjunctivae normal.  Cardiovascular:     Rate  and Rhythm: Normal rate and regular rhythm.     Pulses: Normal pulses.  Pulmonary:     Effort: Pulmonary effort is normal. No respiratory distress.     Breath sounds: Normal breath sounds. No wheezing or rales.  Abdominal:     General: Bowel sounds are normal. There is no distension.     Palpations: Abdomen is soft.     Tenderness: There is no abdominal tenderness.  Musculoskeletal:        General: No swelling or tenderness.     Cervical back: Normal range of motion and neck supple.  Skin:    General: Skin is warm and dry.  Neurological:      General: No focal deficit present.     Mental Status: She is alert and oriented to person, place, and time.     Labs on Admission: I have personally reviewed following labs and imaging studies  CBC: Recent Labs  Lab 06/21/21 1013  WBC 6.6  HGB 11.4*  HCT 37.0  MCV 72.5*  PLT 341   Basic Metabolic Panel: Recent Labs  Lab 06/21/21 1013  NA 137  K 3.3*  CL 105  CO2 23  GLUCOSE 94  BUN 13  CREATININE 0.79  CALCIUM 9.0   GFR: CrCl cannot be calculated (Unknown ideal weight.). Liver Function Tests: No results for input(s): AST, ALT, ALKPHOS, BILITOT, PROT, ALBUMIN in the last 168 hours. No results for input(s): LIPASE, AMYLASE in the last 168 hours. No results for input(s): AMMONIA in the last 168 hours. Coagulation Profile: No results for input(s): INR, PROTIME in the last 168 hours. Cardiac Enzymes: No results for input(s): CKTOTAL, CKMB, CKMBINDEX, TROPONINI in the last 168 hours. BNP (last 3 results) No results for input(s): PROBNP in the last 8760 hours. HbA1C: No results for input(s): HGBA1C in the last 72 hours. CBG: No results for input(s): GLUCAP in the last 168 hours. Lipid Profile: No results for input(s): CHOL, HDL, LDLCALC, TRIG, CHOLHDL, LDLDIRECT in the last 72 hours. Thyroid Function Tests: No results for input(s): TSH, T4TOTAL, FREET4, T3FREE, THYROIDAB in the last 72 hours. Anemia Panel: No results for input(s): VITAMINB12, FOLATE, FERRITIN, TIBC, IRON, RETICCTPCT in the last 72 hours. Urine analysis:    Component Value Date/Time   COLORURINE YELLOW 08/22/2015 1950   APPEARANCEUR CLEAR 08/22/2015 1950   LABSPEC 1.015 08/22/2015 1950   PHURINE 7.0 08/22/2015 1950   GLUCOSEU NEGATIVE 08/22/2015 1950   HGBUR TRACE (A) 08/22/2015 1950   BILIRUBINUR NEGATIVE 08/22/2015 1950   KETONESUR NEGATIVE 08/22/2015 1950   PROTEINUR 30 (A) 08/22/2015 1950   NITRITE NEGATIVE 08/22/2015 1950   LEUKOCYTESUR NEGATIVE 08/22/2015 1950    Radiological Exams on  Admission: DG Chest 2 View  Result Date: 06/21/2021 CLINICAL DATA:  30 year old female with chest pain and shortness of breath. EXAM: CHEST - 2 VIEW COMPARISON:  05/17/2021. FINDINGS: Lung volumes and mediastinal contours remain normal. Visualized tracheal air column is within normal limits. Lung markings are stable and within normal limits. Both lungs appear clear. Abdomen is shielded. No acute osseous abnormality identified. IMPRESSION: Negative.  No cardiopulmonary abnormality. Electronically Signed   By: Odessa Fleming M.D.   On: 06/21/2021 10:11   CT Angio Chest PE W and/or Wo Contrast  Result Date: 06/21/2021 CLINICAL DATA:  Chest pain EXAM: CT ANGIOGRAPHY CHEST WITH CONTRAST TECHNIQUE: Multidetector CT imaging of the chest was performed using the standard protocol during bolus administration of intravenous contrast. Multiplanar CT image reconstructions and MIPs were obtained to evaluate  the vascular anatomy. CONTRAST:  OMNIPAQUE IOHEXOL 350 MG/ML SOLN COMPARISON:  Chest x-ray 06/21/2021 FINDINGS: Cardiovascular: Satisfactory opacification of the pulmonary arteries to the segmental level. No evidence of pulmonary embolism. Normal heart size. No pericardial effusion. Nonaneurysmal aorta. Mediastinum/Nodes: No enlarged mediastinal, hilar, or axillary lymph nodes. Thyroid gland, trachea, and esophagus demonstrate no significant findings. Lungs/Pleura: Lungs are clear. No pleural effusion or pneumothorax. Upper Abdomen: No acute abnormality. Musculoskeletal: No chest wall abnormality. No acute or significant osseous findings. Review of the MIP images confirms the above findings. IMPRESSION: Negative for acute pulmonary embolus.  Clear lung fields. Electronically Signed   By: Jasmine Pang M.D.   On: 06/21/2021 18:31    EKG: Independently reviewed.  Sinus rhythm, occasional PVCs, borderline T wave abnormalities in inferior and lateral leads.  Assessment/Plan Principal Problem:   Chest pain Active  Problems:   Obesity   SOB (shortness of breath)   Microcytic anemia   Hypokalemia   Chest pain No significant risk factors for CAD except obesity and family history (paternal grandparents).  EKG showing borderline T wave abnormalities in inferior and lateral leads.  ACS less likely as high-sensitivity troponin elevated but stable (97 > 91).  Troponin was normal on labs done last month.  CT angiogram chest negative for PE and showing clear lung fields.  Not endorsing any GERD symptoms at present.  Cardiology recommended obtaining echocardiogram. -Cardiac monitoring.  Full dose aspirin administered in the ED.  Echocardiogram ordered.  Chronic microcytic anemia Hemoglobin currently stable.  Mild hypokalemia -Replace potassium.  Check magnesium level and replace if low.  Obesity -Encourage lifestyle modifications such as healthy diet and exercise.  DVT prophylaxis: Lovenox Code Status: Full code Family Communication: Husband at bedside. Disposition Plan: Status is: Observation  The patient remains OBS appropriate and will d/c before 2 midnights.  Level of care: Level of care: Telemetry Cardiac  The medical decision making on this patient was of high complexity and the patient is at high risk for clinical deterioration, therefore this is a level 3 visit.  John Giovanni MD [facility]  If 7PM-7AM, please contact night-coverage [email or web address]  06/21/2021, 7:49 PM

## 2021-06-21 NOTE — ED Triage Notes (Signed)
Pt. Stated, ive had chest pain for about a month. Its just worse last night and my heart beating hard last night. No other sumptoms

## 2021-06-21 NOTE — ED Provider Notes (Signed)
[facility] EMERGENCY DEPARTMENT Provider Note   CSN: [id number] Arrival date & time: 06/21/21  0915     History Chief Complaint  Patient presents with   Chest Pain   Shortness of Breath    Judith Ruiz is a 30 y.o. female.  Sweats chest pain describes it as a rapid heart rate palpitation like sensation in her mid chest associated with component of sharp pain this has been waxing waning intermittent for the past month or so.  She was seen in the ER several weeks ago had a negative work-up with 2 sets of troponins are unremarkable ultimately discharged home.  She had recurrence of a more severe similar episode yesterday and today so presents to the ER.  Currently states the pain is much more subdued and very mild but it was more intense at home earlier today.  No reports of fevers or cough no vomiting or diarrhea.  Otherwise no smoking history no oral contraceptive history no family history of MI age less than 72 years old.      Past Medical History:  Diagnosis Date   Medical history non-contributory     Patient Active Problem List   Diagnosis Date Noted   Chest pain 06/21/2021   Spontaneous vaginal delivery 08/28/2015   Normal labor 08/27/2015   Obesity 01/04/2015    Past Surgical History:  Procedure Laterality Date   NO PAST SURGERIES       OB History     Gravida  1   Para  1   Term  1   Preterm      AB      Living  1      SAB      IAB      Ectopic      Multiple  0   Live Births  1           Family History  Problem Relation Age of Onset   Hyperlipidemia Father     Social History   Tobacco Use   Smoking status: Never   Smokeless tobacco: Never  Substance Use Topics   Alcohol use: No    Alcohol/week: 0.0 standard drinks   Drug use: No    Home Medications Prior to Admission medications   Medication Sig Start Date End Date Taking? Authorizing Provider  acetaminophen (TYLENOL) 500 MG tablet Take 500 mg  by mouth every 6 (six) hours as needed for moderate pain or mild pain.   Yes [provider]  ibuprofen (ADVIL) 200 MG tablet Take 600 mg by mouth every 6 (six) hours as needed for mild pain or moderate pain.   Yes [provider]    Allergies    Patient has no known allergies.  Review of Systems   Review of Systems  Constitutional:  Negative for fever.  HENT:  Negative for ear pain.   Eyes:  Negative for pain.  Respiratory:  Negative for cough.   Cardiovascular:  Positive for chest pain.  Gastrointestinal:  Negative for abdominal pain.  Genitourinary:  Negative for flank pain.  Musculoskeletal:  Negative for back pain.  Skin:  Negative for rash.  Neurological:  Negative for headaches.   Physical Exam Updated Vital Signs BP 125/67    Pulse 91    Temp 98.5 F (36.9 C) (Oral)    Resp 13    LMP 06/14/2021    SpO2 99%   Physical Exam Constitutional:      General: She is not  in acute distress.    Appearance: Normal appearance.  HENT:     Head: Normocephalic.     Nose: Nose normal.  Eyes:     Extraocular Movements: Extraocular movements intact.  Cardiovascular:     Rate and Rhythm: Normal rate.  Pulmonary:     Effort: Pulmonary effort is normal.  Musculoskeletal:        General: Normal range of motion.     Cervical back: Normal range of motion.  Neurological:     General: No focal deficit present.     Mental Status: She is alert. Mental status is at baseline.    ED Results / Procedures / Treatments   Labs (all labs ordered are listed, but only abnormal results are displayed) Labs Reviewed  CBC - Abnormal; Notable for the following components:      Result Value   Hemoglobin 11.4 (*)    MCV 72.5 (*)    MCH 22.4 (*)    RDW 16.6 (*)    All other components within normal limits  BASIC METABOLIC PANEL - Abnormal; Notable for the following components:   Potassium 3.3 (*)    All other components within normal limits  TROPONIN I (HIGH SENSITIVITY) -  Abnormal; Notable for the following components:   Troponin I (High Sensitivity) 97 (*)    All other components within normal limits  TROPONIN I (HIGH SENSITIVITY) - Abnormal; Notable for the following components:   Troponin I (High Sensitivity) 91 (*)    All other components within normal limits  RESP PANEL BY RT-PCR (FLU A&B, COVID) ARPGX2  I-STAT BETA HCG BLOOD, ED ([facility], [facility], [facility] ONLY)    EKG EKG Interpretation  Date/Time:  Friday June 21 2021 09:11:34 EST Ventricular Rate:  97 PR Interval:  136 QRS Duration: 86 QT Interval:  328 QTC Calculation: 416 R Axis:   62 Text Interpretation: Sinus rhythm with sinus arrhythmia with occasional Premature ventricular complexes ST & T wave abnormality, consider inferior ischemia Abnormal ECG Confirmed by Norman Clay ([id number]) on 06/21/2021 4:27:08 PM  Radiology DG Chest 2 View  Result Date: 06/21/2021 CLINICAL DATA:  30 year old female with chest pain and shortness of breath. EXAM: CHEST - 2 VIEW COMPARISON:  05/17/2021. FINDINGS: Lung volumes and mediastinal contours remain normal. Visualized tracheal air column is within normal limits. Lung markings are stable and within normal limits. Both lungs appear clear. Abdomen is shielded. No acute osseous abnormality identified. IMPRESSION: Negative.  No cardiopulmonary abnormality. Electronically Signed   By: Odessa Fleming M.D.   On: 06/21/2021 10:11   CT Angio Chest PE W and/or Wo Contrast  Result Date: 06/21/2021 CLINICAL DATA:  Chest pain EXAM: CT ANGIOGRAPHY CHEST WITH CONTRAST TECHNIQUE: Multidetector CT imaging of the chest was performed using the standard protocol during bolus administration of intravenous contrast. Multiplanar CT image reconstructions and MIPs were obtained to evaluate the vascular anatomy. CONTRAST:  OMNIPAQUE IOHEXOL 350 MG/ML SOLN COMPARISON:  Chest x-ray 06/21/2021 FINDINGS: Cardiovascular: Satisfactory opacification of the pulmonary arteries to the segmental level. No  evidence of pulmonary embolism. Normal heart size. No pericardial effusion. Nonaneurysmal aorta. Mediastinum/Nodes: No enlarged mediastinal, hilar, or axillary lymph nodes. Thyroid gland, trachea, and esophagus demonstrate no significant findings. Lungs/Pleura: Lungs are clear. No pleural effusion or pneumothorax. Upper Abdomen: No acute abnormality. Musculoskeletal: No chest wall abnormality. No acute or significant osseous findings. Review of the MIP images confirms the above findings. IMPRESSION: Negative for acute pulmonary embolus.  Clear lung fields. Electronically Signed   By:  Jasmine Pang M.D.   On: 06/21/2021 18:31    Procedures .Critical Care Performed by: Cheryll Cockayne, MD Authorized by: Cheryll Cockayne, MD   Critical care provider statement:    Critical care time (minutes):  40   Critical care time was exclusive of:  Separately billable procedures and treating other patients and teaching time   Critical care was necessary to treat or prevent imminent or life-threatening deterioration of the following conditions:  Cardiac failure Comments:     Elevated troponin   Medications Ordered in ED Medications  aspirin tablet 325 mg (has no administration in time range)  iohexol (OMNIPAQUE) 350 MG/ML injection 100 mL (100 mLs Intravenous Contrast Given 06/21/21 1815)    ED Course  I have reviewed the triage vital signs and the nursing notes.  Pertinent labs & imaging results that were available during my care of the patient were reviewed by me and considered in my medical decision making (see chart for details).    MDM Rules/Calculators/A&P                         EKG shows sinus rhythm no ST elevations.  Mild ST depression seen in inferior lateral leads.  Initial troponin elevated at 90.  Repeat troponin again similar to 90 again.  CT angio pursued with no evidence of pulmonary embolism.  Case discussed with on-call cardiologist Dr. Royann Shivers, recommends observation stay and  echocardiogram.  Hospitalist consulted for observation stay.     Final Clinical Impression(s) / ED Diagnoses Final diagnoses:  Midsternal chest pain  Elevated troponin    Rx / DC Orders ED Discharge Orders     None        Cheryll Cockayne, MD 06/21/21 [phone]

## 2021-06-22 ENCOUNTER — Observation Stay (HOSPITAL_BASED_OUTPATIENT_CLINIC_OR_DEPARTMENT_OTHER): Payer: BC Managed Care – PPO

## 2021-06-22 DIAGNOSIS — R079 Chest pain, unspecified: Secondary | ICD-10-CM | POA: Diagnosis not present

## 2021-06-22 DIAGNOSIS — D509 Iron deficiency anemia, unspecified: Secondary | ICD-10-CM

## 2021-06-22 DIAGNOSIS — R0602 Shortness of breath: Secondary | ICD-10-CM

## 2021-06-22 DIAGNOSIS — E876 Hypokalemia: Secondary | ICD-10-CM | POA: Diagnosis not present

## 2021-06-22 DIAGNOSIS — Z6841 Body Mass Index (BMI) 40.0 and over, adult: Secondary | ICD-10-CM

## 2021-06-22 LAB — C-REACTIVE PROTEIN: CRP: 3.6 mg/dL — ABNORMAL HIGH

## 2021-06-22 LAB — ECHOCARDIOGRAM COMPLETE
AR max vel: 2.48 cm2
AV Area VTI: 2.63 cm2
AV Area mean vel: 2.39 cm2
AV Mean grad: 4 mmHg
AV Peak grad: 6.7 mmHg
Ao pk vel: 1.29 m/s
Area-P 1/2: 2.06 cm2
Height: 65 in
MV VTI: 2.49 cm2
S' Lateral: 3.1 cm
Weight: 4448 [oz_av]

## 2021-06-22 LAB — SEDIMENTATION RATE: Sed Rate: 9 mm/h (ref 0–22)

## 2021-06-22 LAB — HIV ANTIBODY (ROUTINE TESTING W REFLEX): HIV Screen 4th Generation wRfx: NONREACTIVE

## 2021-06-22 MED ORDER — PANTOPRAZOLE SODIUM 40 MG PO TBEC: 40.0000 mg | DELAYED_RELEASE_TABLET | Freq: Every day | ORAL | 0 refills | Status: DC

## 2021-06-22 NOTE — Progress Notes (Signed)
Progress Note  Patient Name: Judith Ruiz Date of Encounter: 06/22/2021  [facility] Cardiologist: Thurmon Fair, MD   Subjective   Currently feeling well without chest pain  Inpatient Medications    Scheduled Meds:  enoxaparin (LOVENOX) injection  40 mg Subcutaneous Q24H   Continuous Infusions:  PRN Meds: acetaminophen   Vital Signs    Vitals:   06/21/21 2051 06/21/21 2111 06/22/21 0017 06/22/21 0423  BP:  122/70 138/79 119/69  Pulse:  85    Resp:    18  Temp: 98 F (36.7 C) 98.9 F (37.2 C) 98.5 F (36.9 C) 98.4 F (36.9 C)  TempSrc: Oral Oral Oral Oral  SpO2:  96%    Weight:  126.1 kg    Height:  5\' 5"  (1.651 m)      Intake/Output Summary (Last 24 hours) at 06/22/2021 1132 Last data filed at 06/21/2021 2230 Gross per 24 hour  Intake 240 ml  Output --  Net 240 ml   Last 3 Weights 06/21/2021 05/17/2021 08/27/2015  Weight (lbs) 278 lb 295 lb 290 lb  Weight (kg) 126.1 kg 133.811 kg 131.543 kg      Telemetry    Sinus rhythm- Personally Reviewed  ECG    Sinus rhythm- Personally Reviewed  Physical Exam   GEN: No acute distress.   Neck: No JVD Cardiac: RRR, no murmurs, rubs, or gallops.  Respiratory: Clear to auscultation bilaterally. GI: Soft, nontender, non-distended  MS: No edema; No deformity. Neuro:  Nonfocal  Psych: Normal affect   Labs    High Sensitivity Troponin:   Recent Labs  Lab 06/21/21 1013 06/21/21 1138  TROPONINIHS 97* 91*     Chemistry Recent Labs  Lab 06/21/21 1013 06/21/21 2134  NA 137  --   K 3.3*  --   CL 105  --   CO2 23  --   GLUCOSE 94  --   BUN 13  --   CREATININE 0.79  --   CALCIUM 9.0  --   MG  --  2.2  GFRNONAA >60  --   ANIONGAP 9  --     Lipids No results for input(s): CHOL, TRIG, HDL, LABVLDL, LDLCALC, CHOLHDL in the last 168 hours.  Hematology Recent Labs  Lab 06/21/21 1013  WBC 6.6  RBC 5.10  HGB 11.4*  HCT 37.0  MCV 72.5*  MCH 22.4*  MCHC 30.8  RDW 16.6*  PLT 341    Thyroid No results for input(s): TSH, FREET4 in the last 168 hours.  BNPNo results for input(s): BNP, PROBNP in the last 168 hours.  DDimer No results for input(s): DDIMER in the last 168 hours.   Radiology    DG Chest 2 View  Result Date: 06/21/2021 CLINICAL DATA:  30 year old female with chest pain and shortness of breath. EXAM: CHEST - 2 VIEW COMPARISON:  05/17/2021. FINDINGS: Lung volumes and mediastinal contours remain normal. Visualized tracheal air column is within normal limits. Lung markings are stable and within normal limits. Both lungs appear clear. Abdomen is shielded. No acute osseous abnormality identified. IMPRESSION: Negative.  No cardiopulmonary abnormality. Electronically Signed   By: 05/19/2021 M.D.   On: 06/21/2021 10:11   CT Angio Chest PE W and/or Wo Contrast  Result Date: 06/21/2021 CLINICAL DATA:  Chest pain EXAM: CT ANGIOGRAPHY CHEST WITH CONTRAST TECHNIQUE: Multidetector CT imaging of the chest was performed using the standard protocol during bolus administration of intravenous contrast. Multiplanar CT image reconstructions and MIPs were obtained to  evaluate the vascular anatomy. CONTRAST:  OMNIPAQUE IOHEXOL 350 MG/ML SOLN COMPARISON:  Chest x-ray 06/21/2021 FINDINGS: Cardiovascular: Satisfactory opacification of the pulmonary arteries to the segmental level. No evidence of pulmonary embolism. Normal heart size. No pericardial effusion. Nonaneurysmal aorta. Mediastinum/Nodes: No enlarged mediastinal, hilar, or axillary lymph nodes. Thyroid gland, trachea, and esophagus demonstrate no significant findings. Lungs/Pleura: Lungs are clear. No pleural effusion or pneumothorax. Upper Abdomen: No acute abnormality. Musculoskeletal: No chest wall abnormality. No acute or significant osseous findings. Review of the MIP images confirms the above findings. IMPRESSION: Negative for acute pulmonary embolus.  Clear lung fields. Electronically Signed   By: Jasmine Pang M.D.   On:  06/21/2021 18:31    Cardiac Studies   Echo pending  Patient Profile     30 y.o. female with a history of obesity presented to the hospital with chest pain, abnormal ECG and mildly elevated troponin  Assessment & Plan    1.  Chest pain: She is fortunately chest pain-free.  She is not having chest pain for quite some time.  If her echo is normal, would be okay for discharge with an outpatient coronary CTA to rule out obstructive coronary artery disease.  We Sat[place] Parco arrange for follow-up in cardiology clinic.     For questions or updates, please contact [facility] Please consult [email or web address] for contact info under        Signed, Sacheen Arrasmith Jorja Loa, MD  06/22/2021, 11:32 AM

## 2021-06-22 NOTE — Discharge Summary (Signed)
Physician Discharge Summary  Judith Ruiz AOZ:[id number] DOB: March 24, 1991 DOA: 06/21/2021  PCP: Patient, No Pcp Per (Inactive)  Admit date: 06/21/2021 Discharge date: 06/22/2021  Admitted From: Home Disposition: Home   Recommendations for Outpatient Follow-up:  Follow up with cardiology after discharge to arrange coronary CTA.  Monitor response to trial of PPI for suspected GERD/esophageal spasm.  Home Health: None Equipment/Devices: None Discharge Condition: Stable CODE STATUS: Full Diet recommendation: Heart healthy  Brief/Interim Summary: Judith Ruiz is a 30 y.o. female with a history of morbid obesity who presented to the ED 12/23 with intermittent episodes of sharp midchest discomfort that first appeared about a month prior. She was seen in ED for this Nov 18, discharged with negative work up including nonelevated cardiac enzymes and unremarkable ECG. Work up here included ECG which showed subtle, less than 1 mm, inferolateral ST depressions. Troponins, which were normal in November, returned abnormal at 43 with a subsequent value of 91.  CTA of the chest was entirely negative, including no PE or pulmonary infiltrates. She was brought in for observation per cardiology recommendations and has had improvement in her pain without directed treatment. Echocardiogram on 12/24 is normal, and cardiology recommends discharge with cardiology follow up to arrange coronary CTA for further risk stratification.   Discharge Diagnoses:  Principal Problem:   Chest pain Active Problems:   Obesity   SOB (shortness of breath)   Microcytic anemia   Hypokalemia  Chest pain: Unclear etiology. ACS is ruled out, though troponin elevation suggests demand myocardial ischemia.  - Follow up with cardiology, planning coronary CTA as outpatient given troponin elevation and risk factor (namely morbid obesity). No coronary calcifications noted on CTA chest.  - Trial PPI daily as she's at  risk for GERD and history is also suggestive of esophageal spasm.    Chronic microcytic anemia: Hgb stable, no bleeding.  - Recheck at follow up.    Mild hypokalemia: Supplemented. Recheck at follow up.   Morbid obesity: Estimated body mass index is 46.26 kg/m as calculated from the following:   Height as of this encounter: 5\' 5"  (1.651 m).   Weight as of this encounter: 126.1 kg.  Discharge Instructions Discharge Instructions     Diet - low sodium heart healthy   Complete by: As directed    Discharge instructions   Complete by: As directed    You were admitted for chest pain. Your work up here has been reassuring with a normal echocardiogram. Cardiology recommends following up as an outpatient for continued management, possibly further testing if symptoms persist. Due to possibility that this is related to spasm of the esophagus, you should try taking protonix once daily for a month. This was sent to your pharmacy which closes at 6pm today. Follow up and/or establish care with your PCP soon following discharge, or seek medical attention sooner if your symptoms worsen.   Increase activity slowly   Complete by: As directed       Allergies as of 06/22/2021   No Known Allergies      Medication List     TAKE these medications    acetaminophen 500 MG tablet Commonly known as: TYLENOL Take 500 mg by mouth every 6 (six) hours as needed for moderate pain or mild pain.   ibuprofen 200 MG tablet Commonly known as: ADVIL Take 600 mg by mouth every 6 (six) hours as needed for mild pain or moderate pain.   pantoprazole 40 MG tablet Commonly known as: Protonix Take  1 tablet (40 mg total) by mouth daily.        Follow-up Information     Croitoru, Mihai, MD. Schedule an appointment as soon as possible for a visit in 1 week(s).   Specialty: Cardiology Why: Call if you are not contacted to schedule an appointment Contact information: [address] [place]  [address] [phone]                No Known Allergies  Consultations: Cardiology  Procedures/Studies: DG Chest 2 View  Result Date: 06/21/2021 CLINICAL DATA:  30 year old female with chest pain and shortness of breath. EXAM: CHEST - 2 VIEW COMPARISON:  05/17/2021. FINDINGS: Lung volumes and mediastinal contours remain normal. Visualized tracheal air column is within normal limits. Lung markings are stable and within normal limits. Both lungs appear clear. Abdomen is shielded. No acute osseous abnormality identified. IMPRESSION: Negative.  No cardiopulmonary abnormality. Electronically Signed   By: Odessa Fleming M.D.   On: 06/21/2021 10:11   CT Angio Chest PE W and/or Wo Contrast  Result Date: 06/21/2021 CLINICAL DATA:  Chest pain EXAM: CT ANGIOGRAPHY CHEST WITH CONTRAST TECHNIQUE: Multidetector CT imaging of the chest was performed using the standard protocol during bolus administration of intravenous contrast. Multiplanar CT image reconstructions and MIPs were obtained to evaluate the vascular anatomy. CONTRAST:  OMNIPAQUE IOHEXOL 350 MG/ML SOLN COMPARISON:  Chest x-ray 06/21/2021 FINDINGS: Cardiovascular: Satisfactory opacification of the pulmonary arteries to the segmental level. No evidence of pulmonary embolism. Normal heart size. No pericardial effusion. Nonaneurysmal aorta. Mediastinum/Nodes: No enlarged mediastinal, hilar, or axillary lymph nodes. Thyroid gland, trachea, and esophagus demonstrate no significant findings. Lungs/Pleura: Lungs are clear. No pleural effusion or pneumothorax. Upper Abdomen: No acute abnormality. Musculoskeletal: No chest wall abnormality. No acute or significant osseous findings. Review of the MIP images confirms the above findings. IMPRESSION: Negative for acute pulmonary embolus.  Clear lung fields. Electronically Signed   By: Jasmine Pang M.D.   On: 06/21/2021 18:31   ECHOCARDIOGRAM COMPLETE  Result Date: 06/22/2021    ECHOCARDIOGRAM REPORT    Patient Name:   Judith Ruiz Date of Exam: 06/22/2021 Medical Rec #:  [id number]               Height:       65.0 in Accession #:    [id number]              Weight:       278.0 lb Date of Birth:  Jan 09, 1991              BSA:          2.275 m Patient Age:    30 years                BP:           119/69 mmHg Patient Gender: F                       HR:           84 bpm. Exam Location:  Inpatient Procedure: 2D Echo, Cardiac Doppler and Color Doppler Indications:    Chest pain  History:        Patient has no prior history of Echocardiogram examinations.                 Signs/Symptoms:Chest Pain and Dyspnea. Abnormal ECG.  Sonographer:    Ross Ludwig RDCS (AE) Referring Phys: [id number] Tyrone Nine  IMPRESSIONS  1. Left ventricular ejection fraction, by estimation, is 55 to 60%. The left ventricle has normal function. The left ventricle has no regional wall motion abnormalities. There is mild left ventricular hypertrophy. Left ventricular diastolic parameters were normal.  2. Right ventricular systolic function is normal. The right ventricular size is normal. Tricuspid regurgitation signal is inadequate for assessing PA pressure.  3. The mitral valve is grossly normal. No evidence of mitral valve regurgitation.  4. The aortic valve was not well visualized. Aortic valve regurgitation is not visualized. No aortic stenosis is present. Aortic valve mean gradient measures 4.0 mmHg.  5. The inferior vena cava is normal in size with greater than 50% respiratory variability, suggesting right atrial pressure of 3 mmHg. Comparison(s): No prior Echocardiogram. FINDINGS  Left Ventricle: Left ventricular ejection fraction, by estimation, is 55 to 60%. The left ventricle has normal function. The left ventricle has no regional wall motion abnormalities. The left ventricular internal cavity size was normal in size. There is  mild left ventricular hypertrophy. Left ventricular diastolic parameters were normal. Right Ventricle: The  right ventricular size is normal. No increase in right ventricular wall thickness. Right ventricular systolic function is normal. Tricuspid regurgitation signal is inadequate for assessing PA pressure. Left Atrium: Left atrial size was normal in size. Right Atrium: Right atrial size was normal in size. Pericardium: There is no evidence of pericardial effusion. Mitral Valve: The mitral valve is grossly normal. No evidence of mitral valve regurgitation. MV peak gradient, 2.1 mmHg. The mean mitral valve gradient is 1.0 mmHg. Tricuspid Valve: The tricuspid valve is grossly normal. Tricuspid valve regurgitation is trivial. Aortic Valve: The aortic valve was not well visualized. Aortic valve regurgitation is not visualized. No aortic stenosis is present. Aortic valve mean gradient measures 4.0 mmHg. Aortic valve peak gradient measures 6.7 mmHg. Aortic valve area, by VTI measures 2.63 cm. Pulmonic Valve: The pulmonic valve was not well visualized. Pulmonic valve regurgitation is trivial. Aorta: The aortic root is normal in size and structure. Venous: The inferior vena cava is normal in size with greater than 50% respiratory variability, suggesting right atrial pressure of 3 mmHg. IAS/Shunts: No atrial level shunt detected by color flow Doppler.  LEFT VENTRICLE PLAX 2D LVIDd:         5.00 cm   Diastology LVIDs:         3.10 cm   LV e' medial:    12.60 cm/s LV PW:         1.20 cm   LV E/e' medial:  7.4 LV IVS:        1.30 cm   LV e' lateral:   11.40 cm/s LVOT diam:     2.00 cm   LV E/e' lateral: 8.1 LV SV:         57 LV SV Index:   25 LVOT Area:     3.14 cm  RIGHT VENTRICLE             IVC RV S prime:     13.70 cm/s  IVC diam: 1.60 cm TAPSE (M-mode): 2.7 cm LEFT ATRIUM             Index        RIGHT ATRIUM           Index LA diam:        3.40 cm 1.49 cm/m   RA Area:     10.40 cm LA Vol (A2C):   44.5 ml 19.56 ml/m  RA Volume:  19.10 ml  8.39 ml/m LA Vol (A4C):   40.3 ml 17.71 ml/m LA Biplane Vol: 46.5 ml 20.44 ml/m   AORTIC VALVE AV Area (Vmax):    2.48 cm AV Area (Vmean):   2.39 cm AV Area (VTI):     2.63 cm AV Vmax:           129.00 cm/s AV Vmean:          90.300 cm/s AV VTI:            0.219 m AV Peak Grad:      6.7 mmHg AV Mean Grad:      4.0 mmHg LVOT Vmax:         102.00 cm/s LVOT Vmean:        68.700 cm/s LVOT VTI:          0.183 m LVOT/AV VTI ratio: 0.84  AORTA Ao Root diam: 2.60 cm Ao Asc diam:  2.70 cm MITRAL VALVE MV Area (PHT): 2.06 cm    SHUNTS MV Area VTI:   2.49 cm    Systemic VTI:  0.18 m MV Peak grad:  2.1 mmHg    Systemic Diam: 2.00 cm MV Mean grad:  1.0 mmHg MV Vmax:       0.73 m/s MV Vmean:      47.7 cm/s MV Decel Time: 368 msec MV E velocity: 92.80 cm/s MV A velocity: 59.20 cm/s MV E/A ratio:  1.57 Nona Dell MD Electronically signed by Nona Dell MD Signature Date/Time: 06/22/2021/12:05:48 PM    Final     Subjective: Had some intermittent discomfort overnight but no sharp pain now.   Discharge Exam: Vitals:   06/22/21 0017 06/22/21 0423  BP: 138/79 119/69  Pulse:    Resp:  18  Temp: 98.5 F (36.9 C) 98.4 F (36.9 C)  SpO2:     General: Pt is alert, awake, not in acute distress Cardiovascular: RRR, S1/S2 +, no rubs, no gallops Respiratory: CTA bilaterally, no wheezing, no rhonchi Abdominal: Soft, NT, ND, bowel sounds + Extremities: No edema, no cyanosis  Labs: BNP (last 3 results) No results for input(s): BNP in the last 8760 hours. Basic Metabolic Panel: Recent Labs  Lab 06/21/21 1013 06/21/21 2134  NA 137  --   K 3.3*  --   CL 105  --   CO2 23  --   GLUCOSE 94  --   BUN 13  --   CREATININE 0.79  --   CALCIUM 9.0  --   MG  --  2.2   Liver Function Tests: No results for input(s): AST, ALT, ALKPHOS, BILITOT, PROT, ALBUMIN in the last 168 hours. No results for input(s): LIPASE, AMYLASE in the last 168 hours. No results for input(s): AMMONIA in the last 168 hours. CBC: Recent Labs  Lab 06/21/21 1013  WBC 6.6  HGB 11.4*  HCT 37.0  MCV 72.5*  PLT  341   Cardiac Enzymes: No results for input(s): CKTOTAL, CKMB, CKMBINDEX, TROPONINI in the last 168 hours. BNP: Invalid input(s): POCBNP CBG: No results for input(s): GLUCAP in the last 168 hours. D-Dimer No results for input(s): DDIMER in the last 72 hours. Hgb A1c No results for input(s): HGBA1C in the last 72 hours. Lipid Profile No results for input(s): CHOL, HDL, LDLCALC, TRIG, CHOLHDL, LDLDIRECT in the last 72 hours. Thyroid function studies No results for input(s): TSH, T4TOTAL, T3FREE, THYROIDAB in the last 72 hours.  Invalid input(s): FREET3 Anemia work up No results for input(s): VITAMINB12,  FOLATE, FERRITIN, TIBC, IRON, RETICCTPCT in the last 72 hours. Urinalysis    Component Value Date/Time   COLORURINE YELLOW 08/22/2015 1950   APPEARANCEUR CLEAR 08/22/2015 1950   LABSPEC 1.015 08/22/2015 1950   PHURINE 7.0 08/22/2015 1950   GLUCOSEU NEGATIVE 08/22/2015 1950   HGBUR TRACE (A) 08/22/2015 1950   BILIRUBINUR NEGATIVE 08/22/2015 1950   KETONESUR NEGATIVE 08/22/2015 1950   PROTEINUR 30 (A) 08/22/2015 1950   NITRITE NEGATIVE 08/22/2015 1950   LEUKOCYTESUR NEGATIVE 08/22/2015 1950    Microbiology Recent Results (from the past 240 hour(s))  Resp Panel by RT-PCR (Flu A&B, Covid) Nasopharyngeal Swab     Status: None   Collection Time: 06/21/21  6:58 PM   Specimen: Nasopharyngeal Swab; Nasopharyngeal(NP) swabs in vial transport medium  Result Value Ref Range Status   SARS Coronavirus 2 by RT PCR NEGATIVE NEGATIVE Final    Comment: (NOTE) SARS-CoV-2 target nucleic acids are NOT DETECTED.  The SARS-CoV-2 RNA is generally detectable in upper respiratory specimens during the acute phase of infection. The lowest concentration of SARS-CoV-2 viral copies this assay can detect is 138 copies/mL. A negative result does not preclude SARS-Cov-2 infection and should not be used as the sole basis for treatment or other patient management decisions. A negative result may occur  with  improper specimen collection/handling, submission of specimen other than nasopharyngeal swab, presence of viral mutation(s) within the areas targeted by this assay, and inadequate number of viral copies(<138 copies/mL). A negative result must be combined with clinical observations, patient history, and epidemiological information. The expected result is Negative.  Fact Sheet for Patients:  BloggerCourse.com  Fact Sheet for Healthcare Providers:  SeriousBroker.it  This test is no t yet approved or cleared by the [place] FDA and  has been authorized for detection and/or diagnosis of SARS-CoV-2 by FDA under an Emergency Use Authorization (EUA). This EUA will remain  in effect (meaning this test can be used) for the duration of the COVID-19 declaration under Section 564(b)(1) of the Act, 21 U.S.C.section 360bbb-3(b)(1), unless the authorization is terminated  or revoked sooner.       Influenza A by PCR NEGATIVE NEGATIVE Final   Influenza B by PCR NEGATIVE NEGATIVE Final    Comment: (NOTE) The Xpert Xpress SARS-CoV-2/FLU/RSV plus assay is intended as an aid in the diagnosis of influenza from Nasopharyngeal swab specimens and should not be used as a sole basis for treatment. Nasal washings and aspirates are unacceptable for Xpert Xpress SARS-CoV-2/FLU/RSV testing.  Fact Sheet for Patients: BloggerCourse.com  Fact Sheet for Healthcare Providers: SeriousBroker.it  This test is not yet approved or cleared by the [place] FDA and has been authorized for detection and/or diagnosis of SARS-CoV-2 by FDA under an Emergency Use Authorization (EUA). This EUA will remain in effect (meaning this test can be used) for the duration of the COVID-19 declaration under Section 564(b)(1) of the Act, 21 U.S.C. section 360bbb-3(b)(1), unless the authorization is terminated  or revoked.  Performed at [facility], [address], [place], [place] [address]     Time coordinating discharge: Approximately 40 minutes  Tyrone Nine, MD  [facility] 06/22/2021, 12:49 PM

## 2021-06-22 NOTE — Progress Notes (Signed)
°  Echocardiogram 2D Echocardiogram has been performed.  Gerda Diss 06/22/2021, 9:49 AM

## 2021-06-22 NOTE — Plan of Care (Signed)

## 2021-07-15 DIAGNOSIS — R12 Heartburn: Secondary | ICD-10-CM | POA: Diagnosis not present

## 2021-07-15 DIAGNOSIS — N914 Secondary oligomenorrhea: Secondary | ICD-10-CM | POA: Diagnosis not present

## 2021-07-15 DIAGNOSIS — Z1159 Encounter for screening for other viral diseases: Secondary | ICD-10-CM | POA: Diagnosis not present

## 2021-07-15 DIAGNOSIS — Z1331 Encounter for screening for depression: Secondary | ICD-10-CM | POA: Diagnosis not present

## 2021-07-15 DIAGNOSIS — R072 Precordial pain: Secondary | ICD-10-CM | POA: Diagnosis not present

## 2021-07-15 DIAGNOSIS — R0602 Shortness of breath: Secondary | ICD-10-CM | POA: Diagnosis not present

## 2021-07-15 DIAGNOSIS — F430 Acute stress reaction: Secondary | ICD-10-CM | POA: Diagnosis not present

## 2021-07-15 DIAGNOSIS — E559 Vitamin D deficiency, unspecified: Secondary | ICD-10-CM | POA: Diagnosis not present

## 2021-07-15 DIAGNOSIS — Z0000 Encounter for general adult medical examination without abnormal findings: Secondary | ICD-10-CM | POA: Diagnosis not present

## 2021-07-15 DIAGNOSIS — Z20822 Contact with and (suspected) exposure to covid-19: Secondary | ICD-10-CM | POA: Diagnosis not present

## 2021-07-15 DIAGNOSIS — Z1339 Encounter for screening examination for other mental health and behavioral disorders: Secondary | ICD-10-CM | POA: Diagnosis not present

## 2021-07-15 DIAGNOSIS — R0982 Postnasal drip: Secondary | ICD-10-CM | POA: Diagnosis not present

## 2021-07-15 DIAGNOSIS — R5383 Other fatigue: Secondary | ICD-10-CM | POA: Diagnosis not present

## 2021-07-15 DIAGNOSIS — Z6841 Body Mass Index (BMI) 40.0 and over, adult: Secondary | ICD-10-CM | POA: Diagnosis not present

## 2021-07-16 DIAGNOSIS — R0789 Other chest pain: Secondary | ICD-10-CM | POA: Diagnosis not present

## 2021-07-22 NOTE — Progress Notes (Signed)
Office Visit    Patient Name: Judith Ruiz Date of Encounter: 07/23/2021  Primary Care Provider:  Knox Royalty, MD Primary Cardiologist:  Thurmon Fair, MD  Chief Complaint    31 year old female with a history of chest pain, abnormal EKG, dyspnea, anemia, and obesity who presents for follow-up related to chest pain.  Past Medical History    Past Medical History:  Diagnosis Date   Obesity    Past Surgical History:  Procedure Laterality Date   NO PAST SURGERIES      Allergies  No Known Allergies  History of Present Illness    31 year old female with the above past medical history including chest pain, abnormal EKG, dyspnea, anemia, and obesity.  Prior to Thanksgiving 2022 she began experiencing intermittent sharp chest pain which radiated to her mid scapular area, associated with occasional dyspnea and lightheadedness, occurring randomly several times per day, lasting 3 to 5 minutes and resolving spontaneously.  She was evaluated in the ED on May 17, 2021 at which time troponins were negative and EKG was unremarkable. However, her symptoms persisted.  She presented to the ED again on 06/21/2021 in the setting of intermittent chest pain and "hard heartbeats."  CT of the chest was negative for PE.  EKG showed subtle, less than 1 mm inferolateral ST depression, troponins were mildly elevated (97>>91).  She was hypokalemic at the time, potassium was supplemented.  Echocardiogram showed EF 55 to 60%, normal LV function, no valvular abnormalities.  She was discharged home with recommendations for outpatient coronary CTA to evaluate for obstructive CAD.  Additionally, she was started on Protonix and referred to GI for further work-up.  She presents today for follow-up. Since her ED visit, she reports an overall improvement in her symptoms, though she continues to have dull intermittent chest discomfort that occurs randomly and resolves spontaneously. She denies any  further dyspnea or lightheadedness.  She does have occasional fleeting palpitations, no associated symptoms. She thinks her symptoms have improved on Protonix. She is following with GI and is scheduled for an endoscopy on 07/26/2021.  She does report an overall increase in stress recently and wonders if this could be contributing to her symptoms.   Home Medications    Current Outpatient Medications  Medication Sig Dispense Refill   acetaminophen (TYLENOL) 500 MG tablet Take 500 mg by mouth every 6 (six) hours as needed for moderate pain or mild pain.     ibuprofen (ADVIL) 200 MG tablet Take 600 mg by mouth every 6 (six) hours as needed for mild pain or moderate pain.     pantoprazole (PROTONIX) 40 MG tablet Take 1 tablet (40 mg total) by mouth daily. 30 tablet 0   metoprolol tartrate (LOPRESSOR) 100 MG tablet Take 1 tablet (100 mg total) by mouth once for 1 dose. 1 tablet 0   No current facility-administered medications for this visit.     Review of Systems    She denies dyspnea, pnd, orthopnea, n, v, dizziness, syncope, edema, weight gain, or early satiety. All other systems reviewed and are otherwise negative except as noted above.   Physical Exam    VS:  BP 100/70    Pulse 91    Ht 5\' 5"  (1.651 m)    Wt 283 lb 14.4 oz (128.8 kg)    SpO2 98%    BMI 47.24 kg/m  GEN: Well nourished, well developed, in no acute distress. HEENT: normal. Neck: Supple, no JVD, carotid bruits, or masses. Cardiac: RRR, no  murmurs, rubs, or gallops. No clubbing, cyanosis, edema.  Radials/DP/PT 2+ and equal bilaterally.  Respiratory:  Respirations regular and unlabored, clear to auscultation bilaterally. GI: Obese, soft, nontender, nondistended, BS + x 4. MS: no deformity or atrophy. Skin: warm and dry, no rash. Neuro:  Strength and sensation are intact. Psych: Normal affect.  Accessory Clinical Findings    ECG personally reviewed by me today - No EKG in office today.   Lab Results  Component Value Date    WBC 6.6 06/21/2021   HGB 11.4 (L) 06/21/2021   HCT 37.0 06/21/2021   MCV 72.5 (L) 06/21/2021   PLT 341 06/21/2021   Lab Results  Component Value Date   CREATININE 0.79 06/21/2021   BUN 13 06/21/2021   NA 137 06/21/2021   K 3.3 (L) 06/21/2021   CL 105 06/21/2021   CO2 23 06/21/2021   Lab Results  Component Value Date   ALT 15 08/22/2015   AST 16 08/22/2015   ALKPHOS 168 (H) 08/22/2015   BILITOT 0.4 08/22/2015   No results found for: CHOL, HDL, LDLCALC, LDLDIRECT, TRIG, CHOLHDL  No results found for: HGBA1C  Assessment & Plan    1. Chest pain, abnormal EKG, elevated troponin: H/o intermittent sharp chest pain, associated with occasional dyspnea, palpitations, and lightheadedness. To ED 06/21/21 for same. EKG showed subtle, less than 1 mm inferolateral ST depression, troponins were mildly elevated (97>>91).  Echo was normal.  She was started on Protonix and referred to GI for further work-up/concern for esophageal spasms. She is scheduled for an endoscopy on 07/26/2021. Her symptoms have improved overall, though she continues to have intermittent dull chest discomfort that occurs randomly, not associated with exertion. He has not had any recurrence of dyspnea or lightheadedness. Will order coronary CTA for further risk stratification and to rule out CAD. May proceed with endoscopy prior to coronary CTA.   2. Dyspnea/lightheadedness/palpitations: She reports occasional fleeting palpitations, no associated symptoms.  EKG in ED did show sinus arrhythmia.  Discussed triggers, warning symptoms, ED precautions. If symptoms persist, could consider monitor at future date.  3. Hypokalemia: K+ was 3.3 at recent ED visit, supplemented. Repeat BMET today.   4. Obesity: Lifestyle modifications with diet and exercise encouraged.   5. Disposition: F/u in 3 months.   Joylene Grapes, NP 07/23/2021, 4:38 PM

## 2021-07-23 ENCOUNTER — Encounter (HOSPITAL_BASED_OUTPATIENT_CLINIC_OR_DEPARTMENT_OTHER): Payer: Self-pay | Admitting: Nurse Practitioner

## 2021-07-23 ENCOUNTER — Ambulatory Visit (INDEPENDENT_AMBULATORY_CARE_PROVIDER_SITE_OTHER): Payer: BC Managed Care – PPO | Admitting: Nurse Practitioner

## 2021-07-23 ENCOUNTER — Other Ambulatory Visit: Payer: Self-pay

## 2021-07-23 VITALS — BP 100/70 | HR 91 | Ht 65.0 in | Wt 283.9 lb

## 2021-07-23 DIAGNOSIS — R0600 Dyspnea, unspecified: Secondary | ICD-10-CM | POA: Diagnosis not present

## 2021-07-23 DIAGNOSIS — E876 Hypokalemia: Secondary | ICD-10-CM

## 2021-07-23 DIAGNOSIS — R778 Other specified abnormalities of plasma proteins: Secondary | ICD-10-CM | POA: Diagnosis not present

## 2021-07-23 DIAGNOSIS — R072 Precordial pain: Secondary | ICD-10-CM | POA: Diagnosis not present

## 2021-07-23 DIAGNOSIS — R002 Palpitations: Secondary | ICD-10-CM

## 2021-07-23 DIAGNOSIS — Z6841 Body Mass Index (BMI) 40.0 and over, adult: Secondary | ICD-10-CM

## 2021-07-23 MED ORDER — METOPROLOL TARTRATE 100 MG PO TABS: 100.0000 mg | ORAL_TABLET | Freq: Two times a day (BID) | ORAL | 3 refills | Status: DC

## 2021-07-23 MED ORDER — METOPROLOL TARTRATE 100 MG PO TABS
100.0000 mg | ORAL_TABLET | Freq: Once | ORAL | 0 refills | Status: DC
Start: 2021-07-23 — End: 2021-10-31

## 2021-07-23 NOTE — Patient Instructions (Signed)
Medication Instructions:  Your Physician recommend you continue on your current medication as directed.    Pleas take Metoprolol 100mg  ( 1 tablet) 2 hours prior to Cardiac CTA   *If you need a refill on your cardiac medications before your next appointment, please call your pharmacy*   Lab Work: Your physician recommends that you return for lab work today-BMET  If you have labs (blood work) drawn today and your tests are completely normal, you will receive your results only by: MyChart Message (if you have MyChart) OR A paper copy in the mail If you have any lab test that is abnormal or we need to change your treatment, we will call you to review the results.   Testing/Procedures:   Your cardiac CT will be scheduled at one of the below locations:   [facility] [address] [place], [place] [id number]  If scheduled at [facility], please arrive at the [facility] main entrance (entrance A) of [facility] 30 minutes prior to test start time. You can use the FREE valet parking offered at the main entrance (encouraged to control the heart rate for the test) Proceed to the [facility] Radiology Department (first floor) to check-in and test prep.   Please follow these instructions carefully (unless otherwise directed):  On the Night Before the Test: Be sure to Drink plenty of water. Do not consume any caffeinated/decaffeinated beverages or chocolate 12 hours prior to your test. Do not take any antihistamines 12 hours prior to your test.  On the Day of the Test: Drink plenty of water until 1 hour prior to the test. Do not eat any food 4 hours prior to the test. You may take your regular medications prior to the test.  Take metoprolol (Lopressor) two hours prior to test. FEMALES- please wear underwire-free bra if available, avoid dresses & tight clothing      After the Test: Drink plenty of water. After receiving IV contrast, you may  experience a mild flushed feeling. This is normal. On occasion, you may experience a mild rash up to 24 hours after the test. This is not dangerous. If this occurs, you can take Benadryl 25 mg and increase your fluid intake. If you experience trouble breathing, this can be serious. If it is severe call 911 IMMEDIATELY. If it is mild, please call our office. If you take any of these medications: Glipizide/Metformin, Avandament, Glucavance, please do not take 48 hours after completing test unless otherwise instructed.  We will call to schedule your test 2-4 weeks out understanding that some insurance companies will need an authorization prior to the service being performed.   For non-scheduling related questions, please contact the cardiac imaging nurse navigator should you have any questions/concerns: [facility], Cardiac Imaging Nurse Navigator [place], Cardiac Imaging Nurse Navigator [facility] Direct Office Dial: [phone]   For scheduling needs, including cancellations and rescheduling, please call [phone], [phone].    Follow-Up: At [facility], you and your health needs are our priority.  As part of our continuing mission to provide you with exceptional heart care, we have created designated Provider Care Teams.  These Care Teams include your primary Cardiologist (physician) and Advanced Practice Providers (APPs -  Physician Assistants and Nurse Practitioners) who all work together to provide you with the care you need, when you need it.  We recommend signing up for the patient portal called "MyChart".  Sign up information is provided  on this After Visit Summary.  MyChart is used to connect with patients for Virtual Visits (Telemedicine).  Patients are able to view lab/test results, encounter notes, upcoming appointments, etc.  Non-urgent messages can be sent to your provider as well.   To learn more about what you can do with MyChart, go to  [email or web address].    Your next appointment:   3 month(s)  The format for your next appointment:   In Person  Provider:   Thurmon Fair, MD

## 2021-07-24 LAB — BASIC METABOLIC PANEL
BUN/Creatinine Ratio: 16 (ref 9–23)
BUN: 13 mg/dL (ref 6–20)
CO2: 24 mmol/L (ref 20–29)
Calcium: 9.3 mg/dL (ref 8.7–10.2)
Chloride: 106 mmol/L (ref 96–106)
Creatinine, Ser: 0.79 mg/dL (ref 0.57–1.00)
Glucose: 99 mg/dL (ref 70–99)
Potassium: 4.1 mmol/L (ref 3.5–5.2)
Sodium: 145 mmol/L — ABNORMAL HIGH (ref 134–144)
eGFR: 104 mL/min/{1.73_m2} (ref >59)

## 2021-07-25 ENCOUNTER — Telehealth (HOSPITAL_BASED_OUTPATIENT_CLINIC_OR_DEPARTMENT_OTHER): Payer: Self-pay

## 2021-07-25 DIAGNOSIS — R072 Precordial pain: Secondary | ICD-10-CM | POA: Diagnosis not present

## 2021-07-25 DIAGNOSIS — R12 Heartburn: Secondary | ICD-10-CM | POA: Diagnosis not present

## 2021-07-25 DIAGNOSIS — E559 Vitamin D deficiency, unspecified: Secondary | ICD-10-CM | POA: Diagnosis not present

## 2021-07-25 DIAGNOSIS — D539 Nutritional anemia, unspecified: Secondary | ICD-10-CM | POA: Diagnosis not present

## 2021-07-25 DIAGNOSIS — Z6841 Body Mass Index (BMI) 40.0 and over, adult: Secondary | ICD-10-CM | POA: Diagnosis not present

## 2021-07-25 NOTE — Telephone Encounter (Addendum)
Results called to patient     ----- Message from Lenna Sciara, NP sent at 07/24/2021  7:35 AM EST ----- Kidney function and electrolytes are stable. Ok to proceed with coronary CTA as planned and follow-up as scheduled. Thank you!

## 2021-07-26 DIAGNOSIS — Z1211 Encounter for screening for malignant neoplasm of colon: Secondary | ICD-10-CM | POA: Diagnosis not present

## 2021-07-26 DIAGNOSIS — R0789 Other chest pain: Secondary | ICD-10-CM | POA: Diagnosis not present

## 2021-07-31 ENCOUNTER — Telehealth (HOSPITAL_COMMUNITY): Payer: Self-pay | Admitting: *Deleted

## 2021-07-31 NOTE — Telephone Encounter (Signed)
Reaching out to patient to offer assistance regarding upcoming cardiac imaging study; pt verbalizes understanding of appt date/time, parking situation and where to check in, pre-test NPO status and medications ordered, and verified current allergies; name and call back number provided for further questions should they arise  Larey Brick RN Navigator Cardiac Imaging [facility] [phone] office [phone] cell  Patient to take 100mg  metoprolol tartrate two hours prior to cardiac CT scan. She states her BP is usually higher than 100/70. She is aware to arrive at 3:30pm for her 4pm scan.

## 2021-08-01 ENCOUNTER — Encounter (HOSPITAL_COMMUNITY): Payer: Self-pay

## 2021-08-01 ENCOUNTER — Ambulatory Visit (HOSPITAL_COMMUNITY)
Admission: RE | Admit: 2021-08-01 | Discharge: 2021-08-01 | Disposition: A | Discharge status: Home / Self Care | Payer: BC Managed Care – PPO | Source: Ambulatory Visit | Attending: Nurse Practitioner | Admitting: Nurse Practitioner

## 2021-08-01 ENCOUNTER — Other Ambulatory Visit: Payer: Self-pay

## 2021-08-01 DIAGNOSIS — R072 Precordial pain: Secondary | ICD-10-CM | POA: Diagnosis not present

## 2021-08-01 MED ORDER — METOPROLOL TARTRATE 5 MG/5ML IV SOLN
10.0000 mg | INTRAVENOUS | Status: AC | PRN
  Administered 2021-08-01: 5 mg via INTRAVENOUS

## 2021-08-01 MED ORDER — DILTIAZEM HCL 25 MG/5ML IV SOLN
INTRAVENOUS | Status: AC
  Filled 2021-08-01: qty 5

## 2021-08-01 MED ORDER — DILTIAZEM HCL 25 MG/5ML IV SOLN: 10.0000 mg | INTRAVENOUS | Status: DC | PRN

## 2021-08-01 MED ORDER — IOHEXOL 350 MG/ML SOLN
95.0000 mL | Freq: Once | INTRAVENOUS | Status: AC | PRN
  Administered 2021-08-01: 95 mL via INTRAVENOUS

## 2021-08-01 MED ORDER — NITROGLYCERIN 0.4 MG SL SUBL
0.8000 mg | SUBLINGUAL_TABLET | Freq: Once | SUBLINGUAL | Status: AC
Start: 2021-08-01 — End: 2021-08-01
  Administered 2021-08-01: 0.8 mg via SUBLINGUAL

## 2021-08-01 MED ORDER — METOPROLOL TARTRATE 5 MG/5ML IV SOLN
INTRAVENOUS | Status: AC
  Administered 2021-08-01: 5 mg via INTRAVENOUS
  Filled 2021-08-01: qty 20

## 2021-08-01 MED ORDER — NITROGLYCERIN 0.4 MG SL SUBL
SUBLINGUAL_TABLET | SUBLINGUAL | Status: AC
  Filled 2021-08-01: qty 2

## 2021-08-02 ENCOUNTER — Telehealth (HOSPITAL_BASED_OUTPATIENT_CLINIC_OR_DEPARTMENT_OTHER): Payer: Self-pay

## 2021-08-02 DIAGNOSIS — K200 Eosinophilic esophagitis: Secondary | ICD-10-CM | POA: Diagnosis not present

## 2021-08-02 NOTE — Telephone Encounter (Addendum)
Results called to patient and left on VM (ok per DPR)   ----- Message from Joylene Grapes, NP sent at 08/02/2021  4:13 AM EST ----- Coronary CTA shows no evidence of blockages in the heart, calcium score of 0. In this case, it is very reassuring that her symptoms are not coming from her heart. Proceed with GI work-up and follow-up as planned. Thank you!

## 2021-09-10 DIAGNOSIS — R0789 Other chest pain: Secondary | ICD-10-CM | POA: Diagnosis not present

## 2021-09-17 DIAGNOSIS — H40013 Open angle with borderline findings, low risk, bilateral: Secondary | ICD-10-CM | POA: Diagnosis not present

## 2021-10-31 ENCOUNTER — Encounter: Payer: Self-pay | Admitting: Cardiovascular Disease

## 2021-10-31 ENCOUNTER — Ambulatory Visit (INDEPENDENT_AMBULATORY_CARE_PROVIDER_SITE_OTHER): Payer: BC Managed Care – PPO

## 2021-10-31 ENCOUNTER — Ambulatory Visit (INDEPENDENT_AMBULATORY_CARE_PROVIDER_SITE_OTHER): Payer: BC Managed Care – PPO | Admitting: Cardiovascular Disease

## 2021-10-31 VITALS — BP 118/74 | HR 84 | Ht 65.0 in | Wt 288.0 lb

## 2021-10-31 DIAGNOSIS — R002 Palpitations: Secondary | ICD-10-CM

## 2021-10-31 NOTE — Progress Notes (Unsigned)
Enrolled patient for a 7 day Zio XT monitor to be mailed to patients home.  

## 2021-10-31 NOTE — Patient Instructions (Signed)
Medication Instructions:  ?No changes ?*If you need a refill on your cardiac medications before your next appointment, please call your pharmacy* ? ? ?Lab Work: ?None ordered ?If you have labs (blood work) drawn today and your tests are completely normal, you will receive your results only by: ?MyChart Message (if you have MyChart) OR ?A paper copy in the mail ?If you have any lab test that is abnormal or we need to change your treatment, we will call you to review the results. ? ? ?Testing/Procedures: ?ZIO XT- Long Term Monitor Instructions ? ?Your physician has requested you wear a ZIO patch monitor for 7 days.  ?This is a single patch monitor. [employer] supplies one patch monitor per enrollment. Additional ?stickers are not available. Please do not apply patch if you will be having a Nuclear Stress Test,  ?Echocardiogram, Cardiac CT, MRI, or Chest Xray during the period you would be wearing the  ?monitor. The patch cannot be worn during these tests. You cannot remove and re-apply the  ?ZIO XT patch monitor.  ?Your ZIO patch monitor will be mailed 3 day USPS to your address on file. It may take 3-5 days  ?to receive your monitor after you have been enrolled.  ?Once you have received your monitor, please review the enclosed instructions. Your monitor  ?has already been registered assigning a specific monitor serial # to you. ? ?Billing and Patient Assistance Program Information ? ?We have supplied [employer] with any of your insurance information on file for billing purposes. ?[employer] offers a sliding scale Patient Assistance Program for patients that do not have  ?insurance, or whose insurance does not completely cover the cost of the ZIO monitor.  ?You must apply for the Patient Assistance Program to qualify for this discounted rate.  ?To apply, please call [employer] at [phone], select option 4, select option 2, ask to apply for  ?Patient Assistance Program. Theodore Demark will ask your household income, and how many  people  ?are in your household. They will quote your out-of-pocket cost based on that information.  ?[employer] will also be able to set up a 48-month interest-free payment plan if needed. ? ?Applying the monitor ?  ?Shave hair from upper left chest.  ?Hold abrader disc by orange tab. Rub abrader in 40 strokes over the upper left chest as  ?indicated in your monitor instructions.  ?Clean area with 4 enclosed alcohol pads. Let dry.  ?Apply patch as indicated in monitor instructions. Patch will be placed under collarbone on left  ?side of chest with arrow pointing upward.  ?Rub patch adhesive wings for 2 minutes. Remove white label marked "1". Remove the white  ?label marked "2". Rub patch adhesive wings for 2 additional minutes.  ?While looking in a mirror, press and release button in center of patch. A small green light will  ?flash 3-4 times. This will be your only indicator that the monitor has been turned on.  ?Do not shower for the first 24 hours. You may shower after the first 24 hours.  ?Press the button if you feel a symptom. You will hear a small click. Record Date, Time and  ?Symptom in the Patient Logbook.  ?When you are ready to remove the patch, follow instructions on the last 2 pages of Patient  ?Logbook. Stick patch monitor onto the last page of Patient Logbook.  ?Place Patient Logbook in the blue and white box. Use locking tab on box and tape box closed  ?securely. The blue and white box  has prepaid postage on it. Please place it in the mailbox as  ?soon as possible. Your physician should have your test results approximately 7 days after the  ?monitor has been mailed back to [facility].  ?Call [facility] at [phone] if you have questions regarding  ?your ZIO XT patch monitor. Call them immediately if you see an orange light blinking on your  ?monitor.  ?If your monitor falls off in less than 4 days, contact our Monitor department at [phone].  ?If your monitor becomes  loose or falls off after 4 days call [employer] at [phone] for  ?suggestions on securing your monitor ? ? ? ?Follow-Up: ?At [facility], you and your health needs are our priority.  As part of our continuing mission to provide you with exceptional heart care, we have created designated Provider Care Teams.  These Care Teams include your primary Cardiologist (physician) and Advanced Practice Providers (APPs -  Physician Assistants and Nurse Practitioners) who all work together to provide you with the care you need, when you need it. ? ?We recommend signing up for the patient portal called "MyChart".  Sign up information is provided on this After Visit Summary.  MyChart is used to connect with patients for Virtual Visits (Telemedicine).  Patients are able to view lab/test results, encounter notes, upcoming appointments, etc.  Non-urgent messages can be sent to your provider as well.   ?To learn more about what you can do with MyChart, go to [email or web address].   ? ?Your next appointment:   ?Follow up as needed with Dr. Royann Shivers ? ? ?Other Instructions ? ?Kardia Mobile [employer]: ?Website: [email or web address] ? ?DR. CROITORU RECOMMENDS YOU PURCHASE  " Kardia" By [employer]. FROM THE  G[employer]  APP PLAY STORE.  ?THE APP IS FREE , BUT THE  EQUIPMENT HAS A COST. ?IT ALLOWS YOU TO OBTAIN A RECORDING OF YOUR HEART RATE AND RHYTHM BY PROVIDING A SHORT STRIP THAT YOU CAN SHARE WITH YOUR PROVIDER.  ? ? ? ?

## 2021-10-31 NOTE — Progress Notes (Signed)
?Cardiology Office Note:   ? ?Date:  10/31/2021  ? ?ID:  Judith Ruiz, DOB 04/23/1991, MRN [id number] ? ?PCP:  Knox Royalty, MD ?  ?[facility] Providers ?Cardiologist:  Thurmon Fair, MD    ? ?Referring MD: Knox Royalty, MD  ? ?Chief Complaint  ?Patient presents with  ? Palpitations  ? ? ?History of Present Illness:   ? ?Judith Ruiz is a 31 y.o. female with a hx of morbid obesity who has had problems with chest discomfort starting in October of last year.  On initial evaluation in the ER in November was unremarkable but in late December she was admitted since troponin was elevated at approximately 90-100 and and adynamic pattern.  CRP was also marginally elevated at that time at 3.6.  She has undergone both an echocardiogram and a coronary CT angiogram that have shown normal findings (normal right and left ventricular size and function, no valvular abnormalities, normal coronary origin without stenoses, no evidence of aortic pathology, no pericardial disease).  Her chest pain has subsided but she continues to have occasional "flutters".  These are not associated with dizziness or syncope.  She does not notice any change in her stamina or shortness of breath.  She has not had problems with leg edema or claudication.  She has mild microcytic anemia, probably iron deficiency.  Otherwise, labs have been consistently normal. ? ?The palpitations do not happen daily, but they happen several times a week.  They sometimes interfere with her ability to concentrate at work. ? ?Past Medical History:  ?Diagnosis Date  ? Obesity   ? ? ?Past Surgical History:  ?Procedure Laterality Date  ? NO PAST SURGERIES    ? ? ?Current Medications: ?Current Meds  ?Medication Sig  ? ibuprofen (ADVIL) 200 MG tablet Take 600 mg by mouth every 6 (six) hours as needed for mild pain or moderate pain.  ? pantoprazole (PROTONIX) 40 MG tablet Take 1 tablet (40 mg total) by mouth daily.  ? Vitamin D, Ergocalciferol, (DRISDOL)  1.25 MG (50000 UNIT) CAPS capsule Take 50,000 Units by mouth once a week.  ?  ? ?Allergies:   Patient has no known allergies.  ? ?Social History  ? ?Socioeconomic History  ? Marital status: Married  ?  Spouse name: Not on file  ? Number of children: Not on file  ? Years of education: Not on file  ? Highest education level: Not on file  ?Occupational History  ? Not on file  ?Tobacco Use  ? Smoking status: Never  ? Smokeless tobacco: Never  ?Substance and Sexual Activity  ? Alcohol use: No  ?  Alcohol/week: 0.0 standard drinks  ? Drug use: No  ? Sexual activity: Yes  ?  Birth control/protection: None  ?Other Topics Concern  ? Not on file  ?Social History Narrative  ? Lives locally w/ husband and 5 y/o dtr.  Works in an office - sedentary.  ? ?Social Determinants of Health  ? ?Financial Resource Strain: Not on file  ?Food Insecurity: Not on file  ?Transportation Needs: Not on file  ?Physical Activity: Not on file  ?Stress: Not on file  ?Social Connections: Not on file  ?  ? ?Family History: ?The patient's family history includes Coronary artery disease in her paternal grandfather and paternal grandmother; Hyperlipidemia in her father. ? ?ROS:   ?Please see the history of present illness.    ? All other systems reviewed and are negative. ? ?EKGs/Labs/Other Studies Reviewed:   ? ?The  following studies were reviewed today: ?Echocardiogram 06/22/2021 ? 1. Left ventricular ejection fraction, by estimation, is 55 to 60%. The  ?left ventricle has normal function. The left ventricle has no regional  ?wall motion abnormalities. There is mild left ventricular hypertrophy.  ?Left ventricular diastolic parameters  ?were normal.  ? 2. Right ventricular systolic function is normal. The right ventricular  ?size is normal. Tricuspid regurgitation signal is inadequate for assessing  ?PA pressure.  ? 3. The mitral valve is grossly normal. No evidence of mitral valve  ?regurgitation.  ? 4. The aortic valve was not well visualized. Aortic  valve regurgitation  ?is not visualized. No aortic stenosis is present. Aortic valve mean  ?gradient measures 4.0 mmHg.  ? 5. The inferior vena cava is normal in size with greater than 50%  ?respiratory variability, suggesting right atrial pressure of 3 mmHg.  ?Coronary CT angiogram 06/22/2019 ?1. Coronary calcium score of 0. This was 0 percentile for age-, sex, ?and race-matched controls. ?  ?2. Normal coronary origin with right dominance. ?  ?3. No evidence of CAD. ?  ?EKG:  EKG is not ordered today.  The ekg ordered 06/21/2021 is personally reviewed and demonstrates sinus rhythm with a single PVC, nonspecific ST-T wave changes ? ?Recent Labs: ?06/21/2021: Hemoglobin 11.4; Magnesium 2.2; Platelets 341 ?07/23/2021: BUN 13; Creatinine, Ser 0.79; Potassium 4.1; Sodium 145  ?Recent Lipid Panel ?No results found for: CHOL, TRIG, HDL, CHOLHDL, VLDL, LDLCALC, LDLDIRECT ? ? ?Risk Assessment/Calculations:   ?  ? ?    ? ?Physical Exam:   ? ?VS:  BP 118/74   Pulse 84   Ht 5\' 5"  (1.651 m)   Wt 288 lb (130.6 kg)   SpO2 99%   BMI 47.93 kg/m?    ? ?Wt Readings from Last 3 Encounters:  ?10/31/21 288 lb (130.6 kg)  ?07/23/21 283 lb 14.4 oz (128.8 kg)  ?06/21/21 278 lb (126.1 kg)  ?  ? ?GEN:  Well nourished, well developed in no acute distress ?HEENT: Normal ?NECK: No JVD; No carotid bruits ?LYMPHATICS: No lymphadenopathy ?CARDIAC: RRR, no murmurs, rubs, gallops ?RESPIRATORY:  Clear to auscultation without rales, wheezing or rhonchi  ?ABDOMEN: Soft, non-tender, non-distended ?MUSCULOSKELETAL:  No edema; No deformity  ?SKIN: Warm and dry ?NEUROLOGIC:  Alert and oriented x 3 ?PSYCHIATRIC:  Normal affect  ? ?ASSESSMENT:   ? ?No diagnosis found. ?PLAN:   ? ?In order of problems listed above: ? ?Palpitations: During her hospitalization no significant arrhythmia was noted, though other than a PVC captured on twelve-lead electrocardiogram.  I suspect that her current palpitations are related to isolated PACs or PVCs.  I have offered  reassurance that he is unlikely to be harmful or a sign of a dangerous outcome in the heart that is otherwise structurally normal.  To clarify her symptoms will order a arrhythmia monitor but also encouraged her to purchase a personal rhythm monitor such as a Kardia mobile or smart watch. ?Morbid obesity: She does not have symptoms of hypersomnolence/sleep apnea, did not have evidence of right heart abnormalities on echo or CT. ? ?   ? ?   ? ? ?Medication Adjustments/Labs and Tests Ordered: ?Current medicines are reviewed at length with the patient today.  Concerns regarding medicines are outlined above.  ?No orders of the defined types were placed in this encounter. ? ?No orders of the defined types were placed in this encounter. ? ? ?There are no Patient Instructions on file for this visit.  ? ?Signed, ?Mads Borgmeyer,  MD  ?10/31/2021 8:20 AM    ?[facility] ? ?

## 2021-11-01 DIAGNOSIS — D539 Nutritional anemia, unspecified: Secondary | ICD-10-CM | POA: Diagnosis not present

## 2021-11-01 DIAGNOSIS — Z6841 Body Mass Index (BMI) 40.0 and over, adult: Secondary | ICD-10-CM | POA: Diagnosis not present

## 2021-11-01 DIAGNOSIS — R0982 Postnasal drip: Secondary | ICD-10-CM | POA: Diagnosis not present

## 2021-11-01 DIAGNOSIS — E559 Vitamin D deficiency, unspecified: Secondary | ICD-10-CM | POA: Diagnosis not present

## 2021-11-10 DIAGNOSIS — R002 Palpitations: Secondary | ICD-10-CM | POA: Diagnosis not present

## 2021-11-25 DIAGNOSIS — R002 Palpitations: Secondary | ICD-10-CM | POA: Diagnosis not present

## 2021-11-26 ENCOUNTER — Encounter: Payer: Self-pay | Admitting: *Deleted

## 2022-01-21 DIAGNOSIS — B351 Tinea unguium: Secondary | ICD-10-CM | POA: Diagnosis not present

## 2022-02-06 ENCOUNTER — Ambulatory Visit (INDEPENDENT_AMBULATORY_CARE_PROVIDER_SITE_OTHER): Payer: BC Managed Care – PPO | Admitting: Podiatry

## 2022-02-06 ENCOUNTER — Encounter: Payer: Self-pay | Admitting: Podiatry

## 2022-02-06 DIAGNOSIS — B351 Tinea unguium: Secondary | ICD-10-CM | POA: Diagnosis not present

## 2022-02-06 DIAGNOSIS — Z6791 Unspecified blood type, Rh negative: Secondary | ICD-10-CM | POA: Insufficient documentation

## 2022-02-06 DIAGNOSIS — B3731 Acute candidiasis of vulva and vagina: Secondary | ICD-10-CM | POA: Insufficient documentation

## 2022-02-06 DIAGNOSIS — N390 Urinary tract infection, site not specified: Secondary | ICD-10-CM | POA: Insufficient documentation

## 2022-02-06 DIAGNOSIS — O360990 Maternal care for other rhesus isoimmunization, unspecified trimester, not applicable or unspecified: Secondary | ICD-10-CM | POA: Insufficient documentation

## 2022-02-06 NOTE — Progress Notes (Signed)
  Subjective:  Patient ID: Judith Ruiz, female    DOB: 1990/08/31,  MRN: [id number]  Chief Complaint  Patient presents with   Nail Problem     R foot Great toe nail fungus    31 y.o. female presents with the above complaint. History confirmed with patient.  She was prescribed terbinafine and LFTs were ordered but she did not understand what her results meant and has not heard back from her doctor about this yet  Objective:  Physical Exam: warm, good capillary refill, no trophic changes or ulcerative lesions, normal DP and PT pulses, normal sensory exam, and right hallux onychomycosis about 75% nail and lunula involvement.     Assessment:   1. Onychomycosis      Plan:  Patient was evaluated and treated and all questions answered.  We discussed etiology treatment options of onychomycosis.  Discussed oral treatment with terbinafine.  She does not have any history of liver disease takes no medications that would interact with it and does not drink alcohol.  I think she is excellent candidate for oral therapy.  I reviewed her LFTs on her patient portal she had on her phone today, everything was in the normal limits with exception of her total bilirubin which was slightly on the low end of normal.  She will discuss this with Dr. Yetta Barre, I do not expect this will give any issues with taking terbinafine.  I will see her back in 3 months for follow-up and we will consider further treatment photographs were taken today.   Return in about 3 months (around 05/09/2022) for follow up after nail fungus treatment.

## 2022-05-08 ENCOUNTER — Ambulatory Visit (INDEPENDENT_AMBULATORY_CARE_PROVIDER_SITE_OTHER): Payer: BC Managed Care – PPO | Admitting: Podiatry

## 2022-05-08 DIAGNOSIS — Z91199 Patient's noncompliance with other medical treatment and regimen due to unspecified reason: Secondary | ICD-10-CM

## 2022-05-09 NOTE — Progress Notes (Signed)
Patient was no-show for appointment today 

## 2022-06-30 NOTE — L&D Delivery Note (Signed)
OB/GYN Faculty Practice Delivery Note  Judith Ruiz is a 32 y.o. G2P1001 s/p SVD at [redacted]w[redacted]d. She was admitted for IOL for LGA.   ROM: 26h 79m with clear fluid GBS Status:  Negative/-- (08/22 0913) Maximum Maternal Temperature: 98.58F  Labor Progress: Initial SVE: 1.5/70/ballotable. SROM. Recurrent lates early  during induction corrected with amnioinfusion. Augmented with pitocin. She then progressed to complete.   Delivery Date/Time: 03/14/23 @1122  Delivery: Called to room and patient was complete and pushing at 1100. Head delivered LOA. Loose nuchal cord present, delivered through. Shoulder and body delivered in usual fashion. Infant with spontaneous cry, placed on mother's abdomen, dried and stimulated. Cord clamped x 2 after 1-minute delay, and cut by FOB. Cord blood drawn. Placenta delivered spontaneously with gentle cord traction. Fundus firm with bimanual massage and Pitocin. Brisk bleeding continued TXA administered, with resolution. Labia, perineum, vagina, and cervix inspected with 1st degree perineal laceration reapaired in typical fashion with 3.0 vicryl.   Baby Weight: 3420g  Placenta: 3 vessel, intact. Sent to L&D Complications: None Lacerations: 1st degree perineal, repaired EBL: 606 mL Analgesia: Epidural   Infant:  APGAR (1 MIN): 8  APGAR (5 MINS): 9   Wyn Forster, MD OB Family Medicine Fellow, [facility], [facility] 03/14/2023, 12:48 PM

## 2022-10-24 DIAGNOSIS — Z3689 Encounter for other specified antenatal screening: Secondary | ICD-10-CM | POA: Diagnosis not present

## 2022-10-24 DIAGNOSIS — Z3200 Encounter for pregnancy test, result unknown: Secondary | ICD-10-CM | POA: Diagnosis not present

## 2022-10-24 DIAGNOSIS — Z3490 Encounter for supervision of normal pregnancy, unspecified, unspecified trimester: Secondary | ICD-10-CM | POA: Diagnosis not present

## 2022-11-05 DIAGNOSIS — Z3A20 20 weeks gestation of pregnancy: Secondary | ICD-10-CM | POA: Diagnosis not present

## 2022-11-05 DIAGNOSIS — O0932 Supervision of pregnancy with insufficient antenatal care, second trimester: Secondary | ICD-10-CM | POA: Diagnosis not present

## 2022-11-05 DIAGNOSIS — Z363 Encounter for antenatal screening for malformations: Secondary | ICD-10-CM | POA: Diagnosis not present

## 2022-12-12 ENCOUNTER — Encounter: Payer: Self-pay | Admitting: Family Medicine

## 2022-12-12 ENCOUNTER — Ambulatory Visit (INDEPENDENT_AMBULATORY_CARE_PROVIDER_SITE_OTHER): Payer: BC Managed Care – PPO | Admitting: Family Medicine

## 2022-12-12 ENCOUNTER — Other Ambulatory Visit (HOSPITAL_COMMUNITY)
Admission: RE | Admit: 2022-12-12 | Discharge: 2022-12-12 | Disposition: A | Discharge status: Home / Self Care | Payer: BC Managed Care – PPO | Source: Ambulatory Visit | Attending: Family Medicine | Admitting: Family Medicine

## 2022-12-12 VITALS — BP 137/75 | HR 108 | Wt 312.0 lb

## 2022-12-12 DIAGNOSIS — O0933 Supervision of pregnancy with insufficient antenatal care, third trimester: Secondary | ICD-10-CM | POA: Diagnosis not present

## 2022-12-12 DIAGNOSIS — O0990 Supervision of high risk pregnancy, unspecified, unspecified trimester: Secondary | ICD-10-CM | POA: Insufficient documentation

## 2022-12-12 DIAGNOSIS — O0932 Supervision of pregnancy with insufficient antenatal care, second trimester: Secondary | ICD-10-CM | POA: Diagnosis not present

## 2022-12-12 DIAGNOSIS — Z6841 Body Mass Index (BMI) 40.0 and over, adult: Secondary | ICD-10-CM | POA: Diagnosis not present

## 2022-12-12 DIAGNOSIS — Z1339 Encounter for screening examination for other mental health and behavioral disorders: Secondary | ICD-10-CM

## 2022-12-12 DIAGNOSIS — Z6791 Unspecified blood type, Rh negative: Secondary | ICD-10-CM | POA: Diagnosis not present

## 2022-12-12 DIAGNOSIS — Z3A26 26 weeks gestation of pregnancy: Secondary | ICD-10-CM | POA: Diagnosis not present

## 2022-12-12 NOTE — Progress Notes (Signed)
Subjective:  Judith Ruiz is a G2P1001 [redacted]w[redacted]d, by 20 week [place], being seen today for her first obstetrical visit.  Her obstetrical history is significant for  normal vaginal delivery 7 years ago. No complications to pregnancy.  She does have a history of oligomenorrhea - goes up to 4 months between menses. Patient does intend to breast feed. Pregnancy history fully reviewed.  Patient reports  cough for 1 month. Has a lot of mucus production that drips down her throat. She also has watery eyes. She tried mucinex, but wasn't helpful .  BP 137/75   Pulse (!) 108   Wt (!) 312 lb (141.5 kg)   LMP 06/12/2022 (Approximate)   BMI 51.92 kg/m   HISTORY: OB History  Gravida Para Term Preterm AB Living  2 1 1     1   SAB IAB Ectopic Multiple Live Births        0 1    # Outcome Date GA Lbr Len/2nd Weight Sex Delivery Anes PTL Lv  2 Current           1 Term 08/28/15 [redacted]w[redacted]d 09:54 / 01:02 7 lb 0.6 oz (3.192 kg) F Vag-Vacuum EPI  LIV    Past Medical History:  Diagnosis Date   Obesity     Past Surgical History:  Procedure Laterality Date   NO PAST SURGERIES      Family History  Problem Relation Age of Onset   Hyperlipidemia Father    Coronary artery disease Paternal Grandmother    Coronary artery disease Paternal Grandfather      Exam  BP 137/75   Pulse (!) 108   Wt (!) 312 lb (141.5 kg)   LMP 06/12/2022 (Approximate)   BMI 51.92 kg/m   Chaperone present during exam  CONSTITUTIONAL: Well-developed, well-nourished female in no acute distress.  HENT:  Normocephalic, atraumatic, External right and left ear normal. Oropharynx is clear and moist EYES: Conjunctivae and EOM are normal. Pupils are equal, round, and reactive to light. No scleral icterus.  NECK: Normal range of motion, supple, no masses.  Normal thyroid.  CARDIOVASCULAR: Normal heart rate noted, regular rhythm RESPIRATORY: Clear to auscultation bilaterally. Effort and breath sounds normal, no problems with  respiration noted. BREASTS: declined ABDOMEN: Soft, normal bowel sounds, no distention noted.  No tenderness, rebound or guarding.  PELVIC: declined MUSCULOSKELETAL: Normal range of motion. No tenderness.  No cyanosis, clubbing, or edema.  2+ distal pulses. SKIN: Skin is warm and dry. No rash noted. Not diaphoretic. No erythema. No pallor. NEUROLOGIC: Alert and oriented to person, place, and time. Normal reflexes, muscle tone coordination. No cranial nerve deficit noted. PSYCHIATRIC: Normal mood and affect. Normal behavior. Normal judgment and thought content.    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 Supervision of high risk pregnancy, antepartum FHT normal GTT next week - CBC/D/Plt+RPR+Rh+ABO+RubIgG... - Urine Culture - [place] MFM OB DETAIL +14 WK; Future - HgB A1c - GC/Chlamydia Probe Amp - Comp Met (CMET) - TSH  2. RhD negative Rhogam in 2 weeks with GTT - [place] MFM OB DETAIL +14 WK; Future  3. Class 3 severe obesity without serious comorbidity with body mass index (BMI) of 45.0 to 49.9 in adult, unspecified obesity type (HCC) Check HgA1c Her history of oligomenorrhea is concerning for PCOS. Will need to screen after pregnancy - CBC/D/Plt+RPR+Rh+ABO+RubIgG... - [place] MFM OB DETAIL +14 WK; Future - HgB A1c - GC/Chlamydia Probe Amp - Comp Met (CMET) - TSH  4. Late prenatal care affecting pregnancy in third trimester - [place] MFM OB DETAIL +14 WK; Future    Initial labs  obtained Continue prenatal vitamins Reviewed n/v relief measures and warning s/s to report Reviewed recommended weight gain based on pre-gravid BMI Encouraged well-balanced diet Genetic & carrier screening discussed: declines Panorama,  Ultrasound discussed; fetal survey: requested CCNC completed> form faxed if has or is planning to apply for medicaid The nature of [facility] [employer] with multiple MDs and other Advanced Practice Providers was explained to patient; also emphasized that fellows, residents, and students are part of our team.   Early screening tests: FBS, A1C, Random CBG, glucose challenge   Problem list reviewed and updated. 75% of 30 min visit spent on counseling and coordination of care.     Levie Heritage 12/12/2022

## 2022-12-12 NOTE — Progress Notes (Signed)
Patient doesn't remember when she went to [facility] but they did ultrasound and gave her due date of Sept 19, 2024.  Patient does remember having period mid December which would correlate with that ultrasound due date.  Patient complaining of cough for approx one month.Armandina Stammer RN

## 2022-12-13 LAB — CBC/D/PLT+RPR+RH+ABO+RUBIGG...
Antibody Screen: NEGATIVE
Basophils Absolute: 0 10*3/uL (ref 0.0–0.2)
Basos: 0 %
EOS (ABSOLUTE): 0.1 10*3/uL (ref 0.0–0.4)
Eos: 1 %
HCV Ab: NONREACTIVE
HIV Screen 4th Generation wRfx: NONREACTIVE
Hematocrit: 35.6 % (ref 34.0–46.6)
Hemoglobin: 11.3 g/dL (ref 11.1–15.9)
Hepatitis B Surface Ag: NEGATIVE
Immature Grans (Abs): 0.1 10*3/uL (ref 0.0–0.1)
Immature Granulocytes: 1 %
Lymphocytes Absolute: 1.5 10*3/uL (ref 0.7–3.1)
Lymphs: 15 %
MCH: 25 pg — ABNORMAL LOW (ref 26.6–33.0)
MCHC: 31.7 g/dL (ref 31.5–35.7)
MCV: 79 fL (ref 79–97)
Monocytes Absolute: 0.6 10*3/uL (ref 0.1–0.9)
Monocytes: 6 %
Neutrophils Absolute: 7.9 10*3/uL — ABNORMAL HIGH (ref 1.4–7.0)
Neutrophils: 77 %
Platelets: 339 10*3/uL (ref 150–450)
RBC: 4.52 x10E6/uL (ref 3.77–5.28)
RDW: 15.3 % (ref 11.7–15.4)
RPR Ser Ql: NONREACTIVE
Rh Factor: NEGATIVE
Rubella Antibodies, IGG: 1.54 index (ref >0.99)
WBC: 10.2 10*3/uL (ref 3.4–10.8)

## 2022-12-13 LAB — COMPREHENSIVE METABOLIC PANEL
ALT: 14 IU/L (ref 0–32)
AST: 8 IU/L (ref 0–40)
Albumin/Globulin Ratio: 1.4
Albumin: 3.8 g/dL — ABNORMAL LOW (ref 4.0–5.0)
Alkaline Phosphatase: 102 IU/L (ref 44–121)
BUN/Creatinine Ratio: 10 (ref 9–23)
BUN: 6 mg/dL (ref 6–20)
Bilirubin Total: <0.2 mg/dL (ref 0.0–1.2)
CO2: 21 mmol/L (ref 20–29)
Calcium: 9.5 mg/dL (ref 8.7–10.2)
Chloride: 105 mmol/L (ref 96–106)
Creatinine, Ser: 0.6 mg/dL (ref 0.57–1.00)
Globulin, Total: 2.8 g/dL (ref 1.5–4.5)
Glucose: 78 mg/dL (ref 70–99)
Potassium: 4.6 mmol/L (ref 3.5–5.2)
Sodium: 140 mmol/L (ref 134–144)
Total Protein: 6.6 g/dL (ref 6.0–8.5)
eGFR: 124 mL/min/{1.73_m2} (ref >59)

## 2022-12-13 LAB — HEMOGLOBIN A1C
Est. average glucose Bld gHb Est-mCnc: 103 mg/dL
Hgb A1c MFr Bld: 5.2 % (ref 4.8–5.6)

## 2022-12-13 LAB — TSH: TSH: 3.83 u[IU]/mL (ref 0.450–4.500)

## 2022-12-13 LAB — HCV INTERPRETATION

## 2022-12-14 LAB — URINE CULTURE

## 2022-12-14 LAB — SPECIMEN STATUS REPORT

## 2022-12-15 LAB — GC/CHLAMYDIA PROBE AMP (~~LOC~~) NOT AT ARMC
Chlamydia: NEGATIVE
Comment: NEGATIVE
Comment: NORMAL
Neisseria Gonorrhea: NEGATIVE

## 2022-12-15 NOTE — Addendum Note (Signed)
Addended by: Levie Heritage on: 12/15/2022 11:00 AM   Modules accepted: Orders

## 2022-12-23 ENCOUNTER — Ambulatory Visit (INDEPENDENT_AMBULATORY_CARE_PROVIDER_SITE_OTHER): Payer: BC Managed Care – PPO | Admitting: Family Medicine

## 2022-12-23 VITALS — BP 151/82 | HR 95 | Wt 314.0 lb

## 2022-12-23 DIAGNOSIS — O0990 Supervision of high risk pregnancy, unspecified, unspecified trimester: Secondary | ICD-10-CM

## 2022-12-23 DIAGNOSIS — O162 Unspecified maternal hypertension, second trimester: Secondary | ICD-10-CM | POA: Diagnosis not present

## 2022-12-23 DIAGNOSIS — Z3A27 27 weeks gestation of pregnancy: Secondary | ICD-10-CM

## 2022-12-23 DIAGNOSIS — Z6791 Unspecified blood type, Rh negative: Secondary | ICD-10-CM

## 2022-12-23 DIAGNOSIS — O360921 Maternal care for other rhesus isoimmunization, second trimester, fetus 1: Secondary | ICD-10-CM

## 2022-12-23 DIAGNOSIS — Z23 Encounter for immunization: Secondary | ICD-10-CM

## 2022-12-23 MED ORDER — RHO D IMMUNE GLOBULIN 1500 UNIT/2ML IJ SOSY
300.0000 ug | PREFILLED_SYRINGE | Freq: Once | INTRAMUSCULAR | Status: AC
  Administered 2022-12-23: 300 ug via INTRAMUSCULAR

## 2022-12-23 NOTE — Patient Instructions (Signed)
Second Trimester of Pregnancy  The second trimester of pregnancy is from week 13 through week 27. This is also called months 4 through 6 of pregnancy. This is often the time when you feel your best. During the second trimester: Morning sickness is less or has stopped. You may have more energy. You may feel hungry more often. At this time, your unborn baby (fetus) is growing very fast. At the end of the sixth month, the unborn baby may be up to 12 inches long and weigh about 1 pounds. You will likely start to feel the baby move between 16 and 20 weeks of pregnancy. Body changes during your second trimester Your body continues to go through many changes during this time. The changes vary and generally return to normal after the baby is born. Physical changes You will gain more weight. You may start to get stretch marks on your hips, belly (abdomen), and breasts. Your breasts will grow and may hurt. Dark spots or blotches may develop on your face. A dark line from your belly button to the pubic area (linea nigra) may appear. You may have changes in your hair. Health changes You may have headaches. You may have heartburn. You may have trouble pooping (constipation). You may have hemorrhoids or swollen, bulging veins (varicose veins). Your gums may bleed. You may pee (urinate) more often. You may have back pain. Follow these instructions at home: Medicines Take over-the-counter and prescription medicines only as told by your doctor. Some medicines are not safe during pregnancy. Take a prenatal vitamin that contains at least 600 micrograms (mcg) of folic acid. Eating and drinking Eat healthy meals that include: Fresh fruits and vegetables. Whole grains. Good sources of protein, such as meat, eggs, or tofu. Low-fat dairy products. Avoid raw meat and unpasteurized juice, milk, and cheese. You may need to take these actions to prevent or treat trouble pooping: Drink enough fluids to keep  your pee (urine) pale yellow. Eat foods that are high in fiber. These include beans, whole grains, and fresh fruits and vegetables. Limit foods that are high in fat and sugar. These include fried or sweet foods. Activity Exercise only as told by your doctor. Most people can do their usual exercise during pregnancy. Try to exercise for 30 minutes at least 5 days a week. Stop exercising if you have pain or cramps in your belly or lower back. Do not exercise if it is too hot or too humid, or if you are in a place of great height (high altitude). Avoid heavy lifting. If you choose to, you may have sex unless your doctor tells you not to. Relieving pain and discomfort Wear a good support bra if your breasts are sore. Take warm water baths (sitz baths) to soothe pain or discomfort caused by hemorrhoids. Use hemorrhoid cream if your doctor approves. Rest with your legs raised (elevated) if you have leg cramps or low back pain. If you develop bulging veins in your legs: Wear support hose as told by your doctor. Raise your feet for 15 minutes, 3-4 times a day. Limit salt in your food. Safety Wear your seat belt at all times when you are in a car. Talk with your doctor if someone is hurting you or yelling at you a lot. Lifestyle Do not use hot tubs, steam rooms, or saunas. Do not douche. Do not use tampons or scented sanitary pads. Avoid cat litter boxes and soil used by cats. These carry germs that can harm your baby   and can cause a loss of your baby by miscarriage or stillbirth. Do not use herbal medicines, illegal drugs, or medicines that are not approved by your doctor. Do not drink alcohol. Do not smoke or use any products that contain nicotine or tobacco. If you need help quitting, ask your doctor. General instructions Keep all follow-up visits. This is important. Ask your doctor about local prenatal classes. Ask your doctor about the right foods to eat or for help finding a  counselor. Where to find more information American Pregnancy Association: americanpregnancy.org [employer]necologists: www.acog.org Office on [employer] Health: MightyReward[employer]co.nz Contact a doctor if: You have a headache that does not go away when you take medicine. You have changes in how you see, or you see spots in front of your eyes. You have mild cramps, pressure, or pain in your lower belly. You continue to feel like you may vomit (nauseous), you vomit, or you have watery poop (diarrhea). You have bad-smelling fluid coming from your vagina. You have pain when you pee or your pee smells bad. You have very bad swelling of your face, hands, ankles, feet, or legs. You have a fever. Get help right away if: You are leaking fluid from your vagina. You have spotting or bleeding from your vagina. You have very bad belly cramping or pain. You have trouble breathing. You have chest pain. You faint. You have not felt your baby move for the time period told by your doctor. You have new or increased pain, swelling, or redness in an arm or leg. Summary The second trimester of pregnancy is from week 13 through week 27 (months 4 through 6). Eat healthy meals. Exercise as told by your doctor. Most people can do their usual exercise during pregnancy. Do not use herbal medicines, illegal drugs, or medicines that are not approved by your doctor. Do not drink alcohol. Call your doctor if you get sick or if you notice anything unusual about your pregnancy. This information is not intended to replace advice given to you by your health care provider. Make sure you discuss any questions you have with your health care provider. Document Revised: 11/23/2019 Document Reviewed: 09/29/2019 Elsevier Patient Education  2024 [employer].

## 2022-12-23 NOTE — Addendum Note (Signed)
Addended by: Mikey Bussing on: 12/23/2022 10:44 AM   Modules accepted: Orders

## 2022-12-23 NOTE — Progress Notes (Signed)
   PRENATAL VISIT NOTE  Subjective:  Judith Ruiz is a 32 y.o. G2P1001 at [redacted]w[redacted]d being seen today for ongoing prenatal care.  She is currently monitored for the following issues for this high-risk pregnancy and has Obesity; RhD negative; Rhesus isoimmunization with antenatal problem; Supervision of high risk pregnancy, antepartum; and Late prenatal care affecting pregnancy in third trimester on their problem list.  Patient reports  intermittent round ligament pain. Denies headache, vision changes, and RUQ pain .  Contractions: Not present. Vag. Bleeding: None.  Movement: Present. Denies leaking of fluid.   The following portions of the patient's history were reviewed and updated as appropriate: allergies, current medications, past family history, past medical history, past social history, past surgical history and problem list.   Objective:   Vitals:   12/23/22 0826 12/23/22 0833  BP: (!) 145/80 (!) 151/82  Pulse: 96 95  Weight: (!) 314 lb (142.4 kg)     Fetal Status: Fetal Heart Rate (bpm): 135 Fundal Height: 29 cm Movement: Present     General:  Alert, oriented and cooperative. Patient is in no acute distress.  Skin: Skin is warm and dry. No rash noted.   Cardiovascular: Normal heart rate noted  Respiratory: Normal respiratory effort, no problems with respiration noted  Abdomen: Soft, gravid, appropriate for gestational age.  Pain/Pressure: Present     Pelvic: Cervical exam deferred        Extremities: Normal range of motion.  Edema: Trace  Mental Status: Normal mood and affect. Normal behavior. Normal judgment and thought content.   Assessment and Plan:  Pregnancy: G2P1001 at [redacted]w[redacted]d 1. Supervision of high risk pregnancy, antepartum - Glucose Tolerance, 2 Hours w/1 Hour  2. RhD negative - rho (d) immune globulin (RHIG/RHOPHYLAC) injection 300 mcg  3. [redacted] weeks gestation of pregnancy - Follow up in 2 weeks or sooner - rho (d) immune globulin (RHIG/RHOPHYLAC) injection  300 mcg - Glucose Tolerance, 2 Hours w/1 Hour - HIV antibody (with reflex) - RPR - CBC - Antibody screen  4. Elevated blood pressure affecting pregnancy in second trimester, antepartum Asymptomatic. Improved to 137/75. Denies BP issue in prior pregnancy or prior to pregnancy - Comprehensive metabolic panel - Protein / creatinine ratio, urine   Preterm labor symptoms and general obstetric precautions including but not limited to vaginal bleeding, contractions, leaking of fluid and fetal movement were reviewed in detail with the patient. Please refer to After Visit Summary for other counseling recommendations.   Return in about 2 weeks (around 01/06/2023) for HROB follow up.  Future Appointments  Date Time Provider Department Center  01/07/2023 10:55 AM Levie Heritage, DO [facility] None  01/13/2023  7:30 AM [facility] NURSE [facility]  01/13/2023  7:45 AM [facility] US5 [facility]  01/21/2023  9:35 AM Levie Heritage, DO [facility] None  02/05/2023  8:55 AM Milas Hock, MD [facility] None  02/19/2023  8:35 AM Levie Heritage, DO [facility] None  02/26/2023  8:35 AM Adrian Blackwater Rhona Raider, DO [facility] None    Crayton Savarese Autry-Lott, DO

## 2022-12-24 LAB — PROTEIN / CREATININE RATIO, URINE
Creatinine, Urine: 80.7 mg/dL
Protein, Ur: 14.9 mg/dL
Protein/Creat Ratio: 185 mg/g creat (ref 0–200)

## 2022-12-24 LAB — CBC
Hematocrit: 32.1 % — ABNORMAL LOW (ref 34.0–46.6)
Hemoglobin: 10.3 g/dL — ABNORMAL LOW (ref 11.1–15.9)
MCH: 24.1 pg — ABNORMAL LOW (ref 26.6–33.0)
MCHC: 32.1 g/dL (ref 31.5–35.7)
MCV: 75 fL — ABNORMAL LOW (ref 79–97)
Platelets: 311 10*3/uL (ref 150–450)
RBC: 4.27 x10E6/uL (ref 3.77–5.28)
RDW: 14.9 % (ref 11.7–15.4)
WBC: 8.6 10*3/uL (ref 3.4–10.8)

## 2022-12-24 LAB — COMPREHENSIVE METABOLIC PANEL
ALT: 9 IU/L (ref 0–32)
AST: 9 IU/L (ref 0–40)
Albumin: 3.4 g/dL — ABNORMAL LOW (ref 4.0–5.0)
Alkaline Phosphatase: 100 IU/L (ref 44–121)
BUN/Creatinine Ratio: 11 (ref 9–23)
BUN: 6 mg/dL (ref 6–20)
Bilirubin Total: <0.2 mg/dL (ref 0.0–1.2)
CO2: 18 mmol/L — ABNORMAL LOW (ref 20–29)
Calcium: 8.9 mg/dL (ref 8.7–10.2)
Chloride: 107 mmol/L — ABNORMAL HIGH (ref 96–106)
Creatinine, Ser: 0.54 mg/dL — ABNORMAL LOW (ref 0.57–1.00)
Globulin, Total: 2.6 g/dL (ref 1.5–4.5)
Glucose: 70 mg/dL (ref 70–99)
Potassium: 4.3 mmol/L (ref 3.5–5.2)
Sodium: 141 mmol/L (ref 134–144)
Total Protein: 6 g/dL (ref 6.0–8.5)
eGFR: 127 mL/min/{1.73_m2} (ref >59)

## 2022-12-24 LAB — GLUCOSE TOLERANCE, 2 HOURS W/ 1HR
Glucose, 1 hour: 125 mg/dL (ref 70–179)
Glucose, 2 hour: 118 mg/dL (ref 70–152)
Glucose, Fasting: 76 mg/dL (ref 70–91)

## 2022-12-24 LAB — RPR: RPR Ser Ql: NONREACTIVE

## 2022-12-24 LAB — ANTIBODY SCREEN: Antibody Screen: NEGATIVE

## 2022-12-24 LAB — HIV ANTIBODY (ROUTINE TESTING W REFLEX): HIV Screen 4th Generation wRfx: NONREACTIVE

## 2023-01-07 ENCOUNTER — Ambulatory Visit (INDEPENDENT_AMBULATORY_CARE_PROVIDER_SITE_OTHER): Payer: BC Managed Care – PPO | Admitting: Family Medicine

## 2023-01-07 VITALS — BP 124/70 | HR 100 | Wt 317.0 lb

## 2023-01-07 DIAGNOSIS — O0990 Supervision of high risk pregnancy, unspecified, unspecified trimester: Secondary | ICD-10-CM

## 2023-01-07 DIAGNOSIS — O99013 Anemia complicating pregnancy, third trimester: Secondary | ICD-10-CM

## 2023-01-07 DIAGNOSIS — O0933 Supervision of pregnancy with insufficient antenatal care, third trimester: Secondary | ICD-10-CM

## 2023-01-07 DIAGNOSIS — Z6841 Body Mass Index (BMI) 40.0 and over, adult: Secondary | ICD-10-CM

## 2023-01-07 DIAGNOSIS — Z6791 Unspecified blood type, Rh negative: Secondary | ICD-10-CM

## 2023-01-07 NOTE — Progress Notes (Signed)
   PRENATAL VISIT NOTE  Subjective:  Judith Ruiz is a 32 y.o. G2P1001 at [redacted]w[redacted]d being seen today for ongoing prenatal care.  She is currently monitored for the following issues for this high-risk pregnancy and has Obesity; RhD negative; Rhesus isoimmunization with antenatal problem; Supervision of high risk pregnancy, antepartum; and Late prenatal care affecting pregnancy in third trimester on their problem list.  Patient reports no complaints.  Contractions: Not present. Vag. Bleeding: None.  Movement: Present. Denies leaking of fluid.   The following portions of the patient's history were reviewed and updated as appropriate: allergies, current medications, past family history, past medical history, past social history, past surgical history and problem list.   Objective:   Vitals:   01/07/23 1049  BP: 124/70  Pulse: 100  Weight: (!) 317 lb (143.8 kg)    Fetal Status: Fetal Heart Rate (bpm): 155   Movement: Present     General:  Alert, oriented and cooperative. Patient is in no acute distress.  Skin: Skin is warm and dry. No rash noted.   Cardiovascular: Normal heart rate noted  Respiratory: Normal respiratory effort, no problems with respiration noted  Abdomen: Soft, gravid, appropriate for gestational age.  Pain/Pressure: Present     Pelvic: Cervical exam deferred        Extremities: Normal range of motion.  Edema: None  Mental Status: Normal mood and affect. Normal behavior. Normal judgment and thought content.   Assessment and Plan:  Pregnancy: G2P1001 at [redacted]w[redacted]d 1. Supervision of high risk pregnancy, antepartum FHT and FH normal  2. RhD negative Rhogam given at 27 weeks  3. Class 3 severe obesity without serious comorbidity with body mass index (BMI) of 45.0 to 49.9 in adult, unspecified obesity type (HCC) [place] next week  4. Late prenatal care affecting pregnancy in third trimester  5. Anemia during pregnancy in third trimester   Preterm labor symptoms and  general obstetric precautions including but not limited to vaginal bleeding, contractions, leaking of fluid and fetal movement were reviewed in detail with the patient. Please refer to After Visit Summary for other counseling recommendations.   No follow-ups on file.  Future Appointments  Date Time Provider Department Center  01/13/2023  7:30 AM [facility] NURSE [facility]  01/13/2023  7:45 AM [facility] US5 [facility]  01/21/2023  9:35 AM Levie Heritage, DO [facility] None  02/05/2023  8:55 AM Milas Hock, MD [facility] None  02/19/2023  8:35 AM Levie Heritage, DO [facility] None  02/26/2023  8:35 AM Levie Heritage, DO [facility] None    Levie Heritage, DO

## 2023-01-13 ENCOUNTER — Other Ambulatory Visit: Payer: Self-pay

## 2023-01-13 ENCOUNTER — Ambulatory Visit: Payer: BC Managed Care – PPO | Attending: Family Medicine

## 2023-01-13 ENCOUNTER — Ambulatory Visit: Payer: BC Managed Care – PPO

## 2023-01-13 DIAGNOSIS — O99213 Obesity complicating pregnancy, third trimester: Secondary | ICD-10-CM

## 2023-01-13 DIAGNOSIS — Z6791 Unspecified blood type, Rh negative: Secondary | ICD-10-CM

## 2023-01-13 DIAGNOSIS — E66813 Obesity, class 3: Secondary | ICD-10-CM

## 2023-01-13 DIAGNOSIS — O0933 Supervision of pregnancy with insufficient antenatal care, third trimester: Secondary | ICD-10-CM

## 2023-01-13 DIAGNOSIS — O0990 Supervision of high risk pregnancy, unspecified, unspecified trimester: Secondary | ICD-10-CM

## 2023-01-13 DIAGNOSIS — Z3A30 30 weeks gestation of pregnancy: Secondary | ICD-10-CM

## 2023-01-13 DIAGNOSIS — O360130 Maternal care for anti-D [Rh] antibodies, third trimester, not applicable or unspecified: Secondary | ICD-10-CM | POA: Diagnosis not present

## 2023-01-13 DIAGNOSIS — Z6841 Body Mass Index (BMI) 40.0 and over, adult: Secondary | ICD-10-CM | POA: Diagnosis not present

## 2023-01-21 ENCOUNTER — Ambulatory Visit (INDEPENDENT_AMBULATORY_CARE_PROVIDER_SITE_OTHER): Payer: BC Managed Care – PPO | Admitting: Family Medicine

## 2023-01-21 VITALS — BP 130/79 | HR 89 | Wt 310.0 lb

## 2023-01-21 DIAGNOSIS — Z6841 Body Mass Index (BMI) 40.0 and over, adult: Secondary | ICD-10-CM

## 2023-01-21 DIAGNOSIS — O0933 Supervision of pregnancy with insufficient antenatal care, third trimester: Secondary | ICD-10-CM

## 2023-01-21 DIAGNOSIS — O0990 Supervision of high risk pregnancy, unspecified, unspecified trimester: Secondary | ICD-10-CM

## 2023-01-21 DIAGNOSIS — Z3A31 31 weeks gestation of pregnancy: Secondary | ICD-10-CM

## 2023-01-21 DIAGNOSIS — Z6791 Unspecified blood type, Rh negative: Secondary | ICD-10-CM

## 2023-01-21 NOTE — Progress Notes (Signed)
   PRENATAL VISIT NOTE  Subjective:  Judith Ruiz is a 32 y.o. G2P1001 at [redacted]w[redacted]d being seen today for ongoing prenatal care.  She is currently monitored for the following issues for this high-risk pregnancy and has Obesity; RhD negative; Rhesus isoimmunization with antenatal problem; Supervision of high risk pregnancy, antepartum; and Late prenatal care affecting pregnancy in third trimester on their problem list.  Patient reports no complaints.   . Vag. Bleeding: None.  Movement: Present. Denies leaking of fluid.   The following portions of the patient's history were reviewed and updated as appropriate: allergies, current medications, past family history, past medical history, past social history, past surgical history and problem list.   Objective:   Vitals:   01/21/23 0939  BP: 130/79  Pulse: 89  Weight: (!) 310 lb (140.6 kg)    Fetal Status: Fetal Heart Rate (bpm): 140   Movement: Present     General:  Alert, oriented and cooperative. Patient is in no acute distress.  Skin: Skin is warm and dry. No rash noted.   Cardiovascular: Normal heart rate noted  Respiratory: Normal respiratory effort, no problems with respiration noted  Abdomen: Soft, gravid, appropriate for gestational age.  Pain/Pressure: Present (Patient states she has lower abd pain when trying to close legs)     Pelvic: Cervical exam deferred        Extremities: Normal range of motion.  Edema: None  Mental Status: Normal mood and affect. Normal behavior. Normal judgment and thought content.   Assessment and Plan:  Pregnancy: G2P1001 at [redacted]w[redacted]d 1. [redacted] weeks gestation of pregnancy  2. Supervision of high risk pregnancy, antepartum FHT normal  3. RhD negative Rhogam at 28 weeks  4. Late prenatal care affecting pregnancy in third trimester  5. Class 3 severe obesity without serious comorbidity with body mass index (BMI) of 45.0 to 49.9 in adult, unspecified obesity type (HCC) Has F/u US in 2 weeks. BPPs  at 34 weeks   Preterm labor symptoms and general obstetric precautions including but not limited to vaginal bleeding, contractions, leaking of fluid and fetal movement were reviewed in detail with the patient. Please refer to After Visit Summary for other counseling recommendations.   No follow-ups on file.  Future Appointments  Date Time Provider Department Center  02/03/2023  7:15 AM [facility] NURSE [facility]  02/03/2023  7:30 AM [facility]  02/05/2023  8:55 AM Milas Hock, MD [facility] None  02/10/2023  7:15 AM [facility] NURSE [facility]  02/10/2023  7:30 AM [facility] US2 [facility]  02/17/2023  7:15 AM [facility] NURSE [facility]  02/17/2023  7:30 AM [facility] US2 [facility]  02/19/2023  8:35 AM Levie Heritage, DO [facility] None  02/23/2023  7:15 AM [facility] NURSE [facility]  02/23/2023  7:30 AM [facility] US2 [facility]  02/26/2023  8:35 AM Adrian Blackwater, Rhona Raider, DO [facility] None    Levie Heritage, DO

## 2023-02-03 ENCOUNTER — Ambulatory Visit: Payer: BC Managed Care – PPO | Attending: Obstetrics

## 2023-02-03 ENCOUNTER — Ambulatory Visit: Payer: BC Managed Care – PPO | Admitting: *Deleted

## 2023-02-03 VITALS — BP 132/73 | HR 89

## 2023-02-03 DIAGNOSIS — Z3A33 33 weeks gestation of pregnancy: Secondary | ICD-10-CM

## 2023-02-03 DIAGNOSIS — O0933 Supervision of pregnancy with insufficient antenatal care, third trimester: Secondary | ICD-10-CM | POA: Diagnosis not present

## 2023-02-03 DIAGNOSIS — Z6791 Unspecified blood type, Rh negative: Secondary | ICD-10-CM | POA: Diagnosis not present

## 2023-02-03 DIAGNOSIS — O360130 Maternal care for anti-D [Rh] antibodies, third trimester, not applicable or unspecified: Secondary | ICD-10-CM

## 2023-02-03 DIAGNOSIS — E669 Obesity, unspecified: Secondary | ICD-10-CM

## 2023-02-03 DIAGNOSIS — O99213 Obesity complicating pregnancy, third trimester: Secondary | ICD-10-CM | POA: Diagnosis not present

## 2023-02-03 DIAGNOSIS — O0990 Supervision of high risk pregnancy, unspecified, unspecified trimester: Secondary | ICD-10-CM | POA: Diagnosis not present

## 2023-02-05 ENCOUNTER — Ambulatory Visit (INDEPENDENT_AMBULATORY_CARE_PROVIDER_SITE_OTHER): Payer: BC Managed Care – PPO | Admitting: Obstetrics and Gynecology

## 2023-02-05 VITALS — BP 117/77 | HR 97 | Wt 320.0 lb

## 2023-02-05 DIAGNOSIS — E66813 Obesity, class 3: Secondary | ICD-10-CM

## 2023-02-05 DIAGNOSIS — Z6791 Unspecified blood type, Rh negative: Secondary | ICD-10-CM

## 2023-02-05 DIAGNOSIS — Z3A34 34 weeks gestation of pregnancy: Secondary | ICD-10-CM

## 2023-02-05 DIAGNOSIS — O0933 Supervision of pregnancy with insufficient antenatal care, third trimester: Secondary | ICD-10-CM

## 2023-02-05 DIAGNOSIS — O0990 Supervision of high risk pregnancy, unspecified, unspecified trimester: Secondary | ICD-10-CM

## 2023-02-05 DIAGNOSIS — Z6841 Body Mass Index (BMI) 40.0 and over, adult: Secondary | ICD-10-CM

## 2023-02-05 NOTE — Progress Notes (Signed)
   PRENATAL VISIT NOTE  Subjective:  Judith Ruiz is a 32 y.o. G2P1001 at [redacted]w[redacted]d being seen today for ongoing prenatal care.  She is currently monitored for the following issues for this low-risk pregnancy and has Obesity; RhD negative; Supervision of high risk pregnancy, antepartum; and Late prenatal care affecting pregnancy in third trimester on their problem list.  Patient reports no complaints.  Contractions: Not present. Vag. Bleeding: None.  Movement: Present. Denies leaking of fluid.   The following portions of the patient's history were reviewed and updated as appropriate: allergies, current medications, past family history, past medical history, past social history, past surgical history and problem list.   Objective:   Vitals:   02/05/23 0857  BP: 117/77  Pulse: 97  Weight: (!) 320 lb (145.2 kg)    Fetal Status: Fetal Heart Rate (bpm): 150 Fundal Height: 35 cm Movement: Present     General:  Alert, oriented and cooperative. Patient is in no acute distress.  Skin: Skin is warm and dry. No rash noted.   Cardiovascular: Normal heart rate noted  Respiratory: Normal respiratory effort, no problems with respiration noted  Abdomen: Soft, gravid, appropriate for gestational age.  Pain/Pressure: Present (pain between legs)     Pelvic: Cervical exam deferred        Extremities: Normal range of motion.  Edema: Moderate pitting, indentation subsides rapidly  Mental Status: Normal mood and affect. Normal behavior. Normal judgment and thought content.   Assessment and Plan:  Pregnancy: G2P1001 at [redacted]w[redacted]d 1. Supervision of high risk pregnancy, antepartum Cultures nv  2. RhD negative Rhogam given already  3. Class 3 severe obesity without serious comorbidity with body mass index (BMI) of 45.0 to 49.9 in adult, unspecified obesity type (HCC) Growth on 8/13. Had first BPP and wnl.   4. Late prenatal care affecting pregnancy in third trimester  5. Pregnancy with 34 completed  weeks gestation   Preterm labor symptoms and general obstetric precautions including but not limited to vaginal bleeding, contractions, leaking of fluid and fetal movement were reviewed in detail with the patient. Please refer to After Visit Summary for other counseling recommendations.   No follow-ups on file.  Future Appointments  Date Time Provider Department Center  02/10/2023  7:15 AM [facility] NURSE [facility]  02/10/2023  7:30 AM [facility] US2 [facility]  02/17/2023  7:15 AM [facility] NURSE [facility]  02/17/2023  7:30 AM [facility] US2 [facility]  02/19/2023  8:35 AM Levie Heritage, DO [facility] None  02/23/2023  7:15 AM [facility] NURSE [facility]  02/23/2023  7:30 AM [facility] US2 [facility]  02/26/2023  8:35 AM Levie Heritage, DO [facility] None  03/05/2023  8:15 AM Levie Heritage, DO [facility] None  03/13/2023  8:55 AM Anyanwu, Jethro Bastos, MD [facility] None    Milas Hock, MD

## 2023-02-08 IMAGING — CT CT ANGIO CHEST
2 of 6 series · 17 of 46 positions shown · IV contrast (APPLIED)
Comparison: Chest x-ray 06/21/2021

CLINICAL DATA: Chest pain

EXAM:
CT ANGIOGRAPHY CHEST WITH CONTRAST
TECHNIQUE: Multidetector CT imaging of the chest was performed using the
standard protocol during bolus administration of intravenous
contrast. Multiplanar CT image reconstructions and MIPs were
obtained to evaluate the vascular anatomy.
CONTRAST:  100mL OMNIPAQUE IOHEXOL 350 MG/ML SOLN

[Series 6: thins · axial · 0.89mm/px · z∈[-100,+163]mm · 14 of 289 slices shown]
[im 13/289  lung]
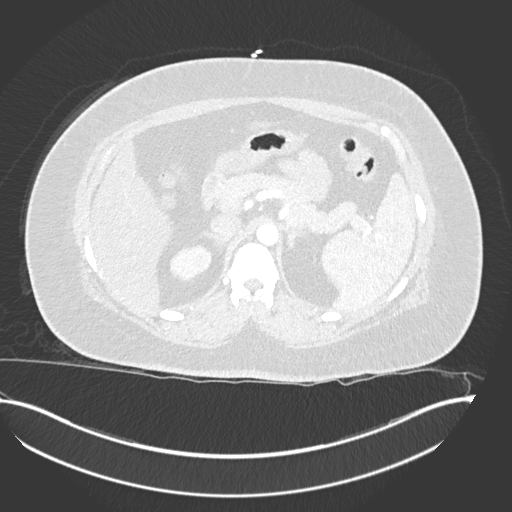
[im 38/289  soft-tissue]
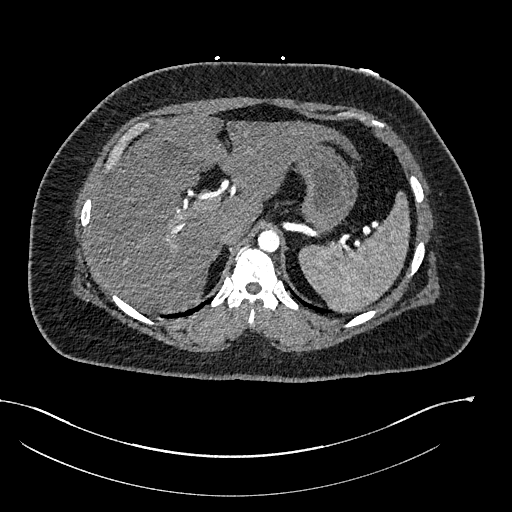
[im 51/289  lung]
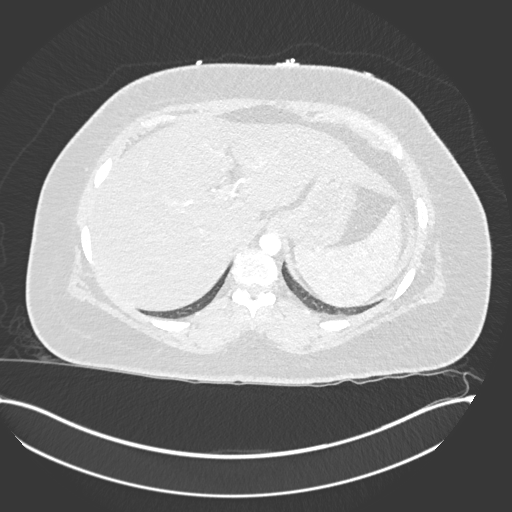
[im 76/289  soft-tissue]
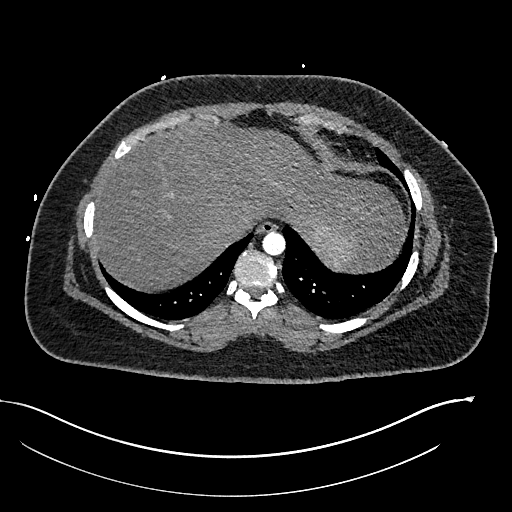
[im 101/289  lung]
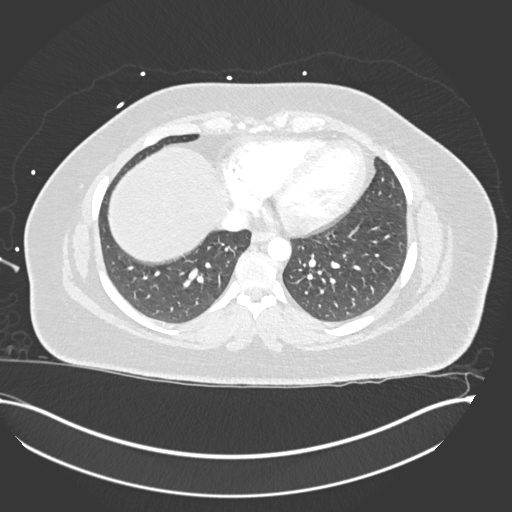
[im 113/289  soft-tissue]
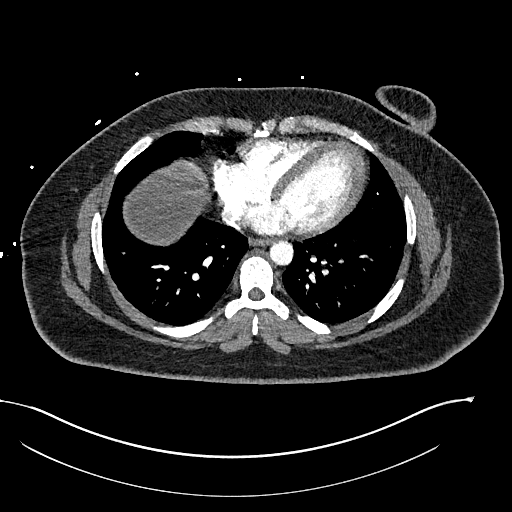
[im 138/289  lung]
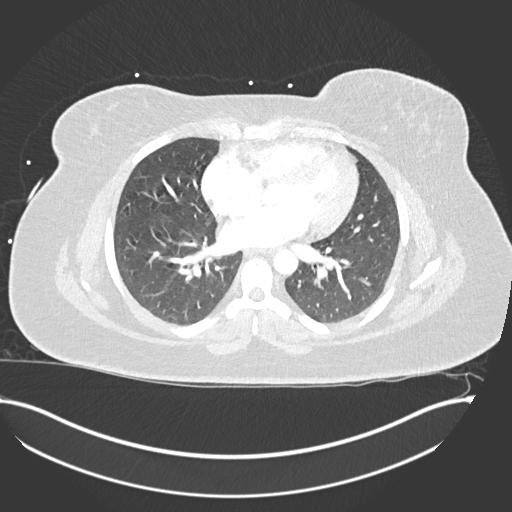
[im 151/289  soft-tissue]
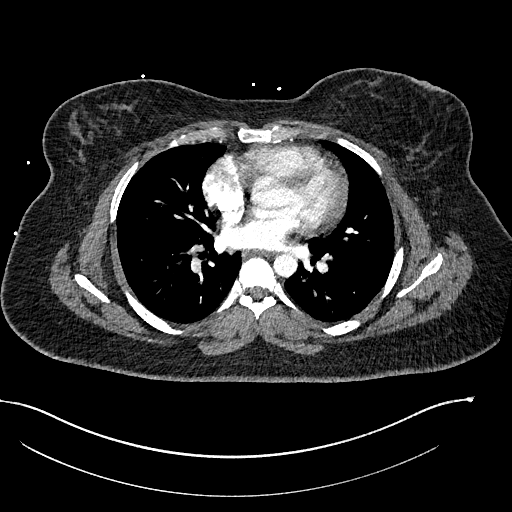
[im 176/289  lung]
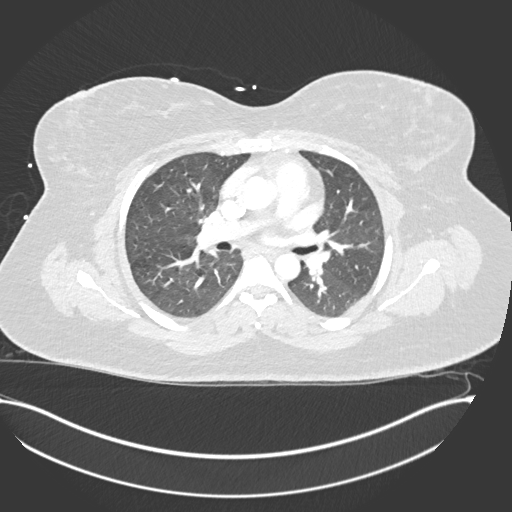
[im 188/289  soft-tissue]
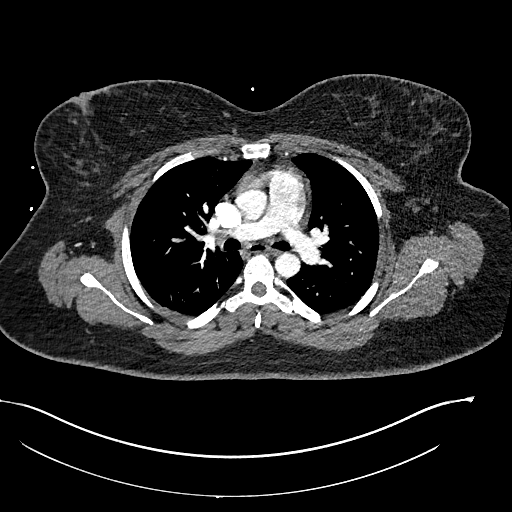
[im 213/289  lung]
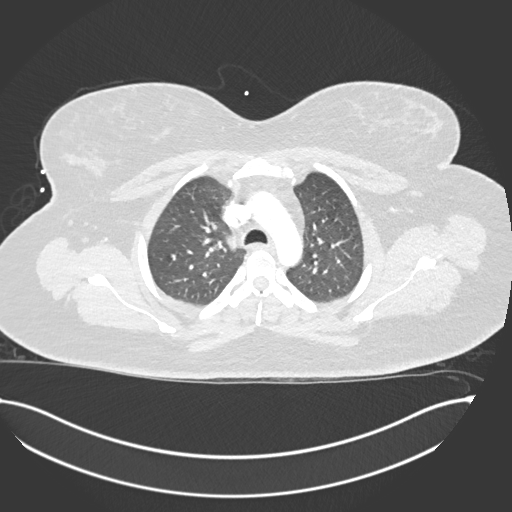
[im 238/289  soft-tissue]
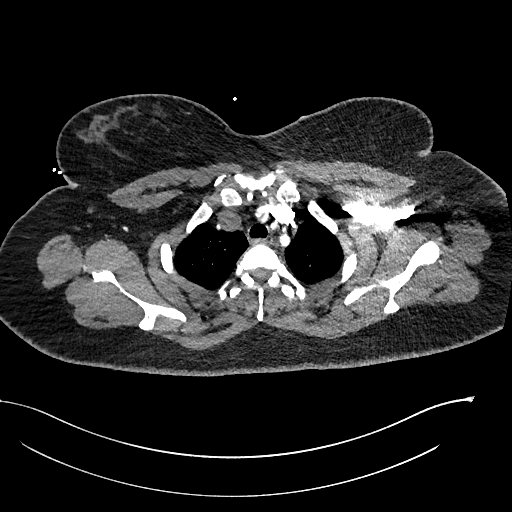
[im 251/289  lung]
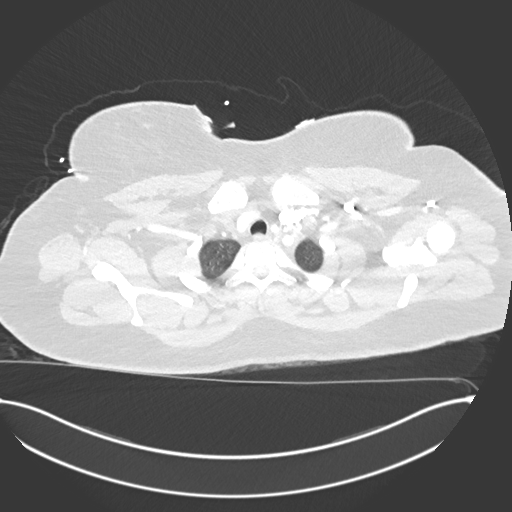
[im 276/289  soft-tissue]
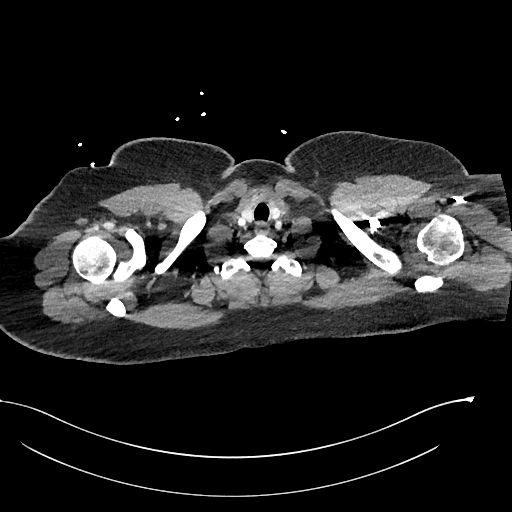

[Series 8: coronal mpr · coronal · 0.60mm/px · 3 of 155 slices shown]
[im 39/155  soft-tissue]
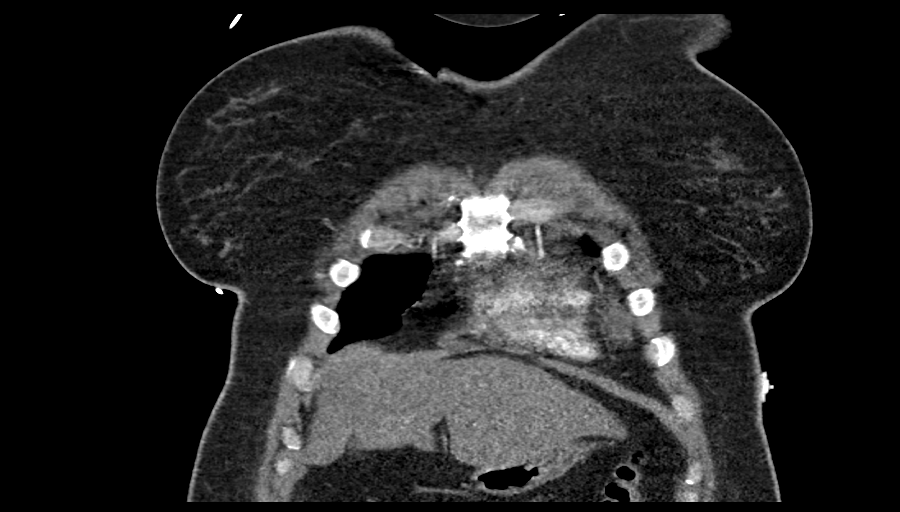
[im 78/155  soft-tissue]
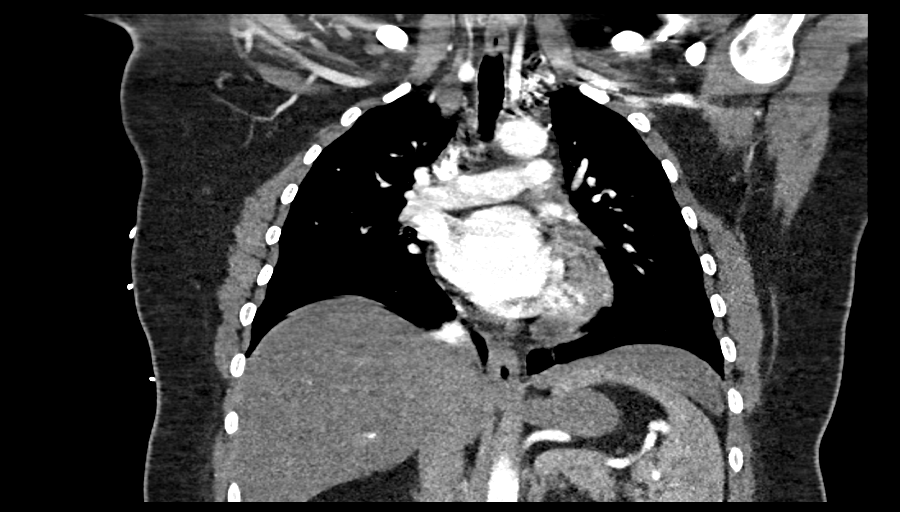
[im 116/155  soft-tissue]
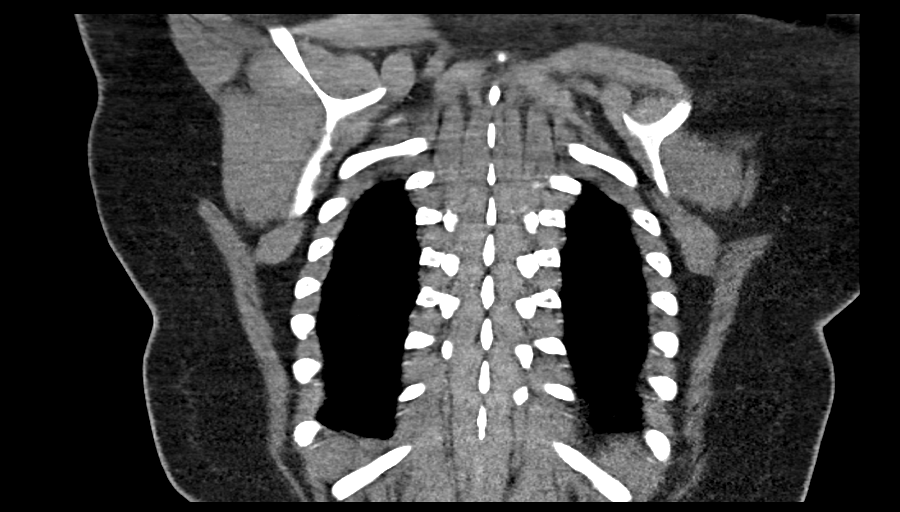

[17 of 46 positions shown; findings below may reference images not displayed]

FINDINGS: Cardiovascular: Satisfactory opacification of the pulmonary arteries
to the segmental level. No evidence of pulmonary embolism. Normal
heart size. No pericardial effusion. Nonaneurysmal aorta.

Mediastinum/Nodes: No enlarged mediastinal, hilar, or axillary lymph
nodes. Thyroid gland, trachea, and esophagus demonstrate no
significant findings.

Lungs/Pleura: Lungs are clear. No pleural effusion or pneumothorax.

Upper Abdomen: No acute abnormality.

Musculoskeletal: No chest wall abnormality. No acute or significant
osseous findings.

Review of the MIP images confirms the above findings.
IMPRESSION: Negative for acute pulmonary embolus.  Clear lung fields.

## 2023-02-08 IMAGING — CR DG CHEST 2V
2 series · 2 of 2 positions shown · non-contrast
Comparison: 05/17/2021.

CLINICAL DATA: 30-year-old female with chest pain and shortness of
breath.

EXAM:
CHEST - 2 VIEW

[chest pa]
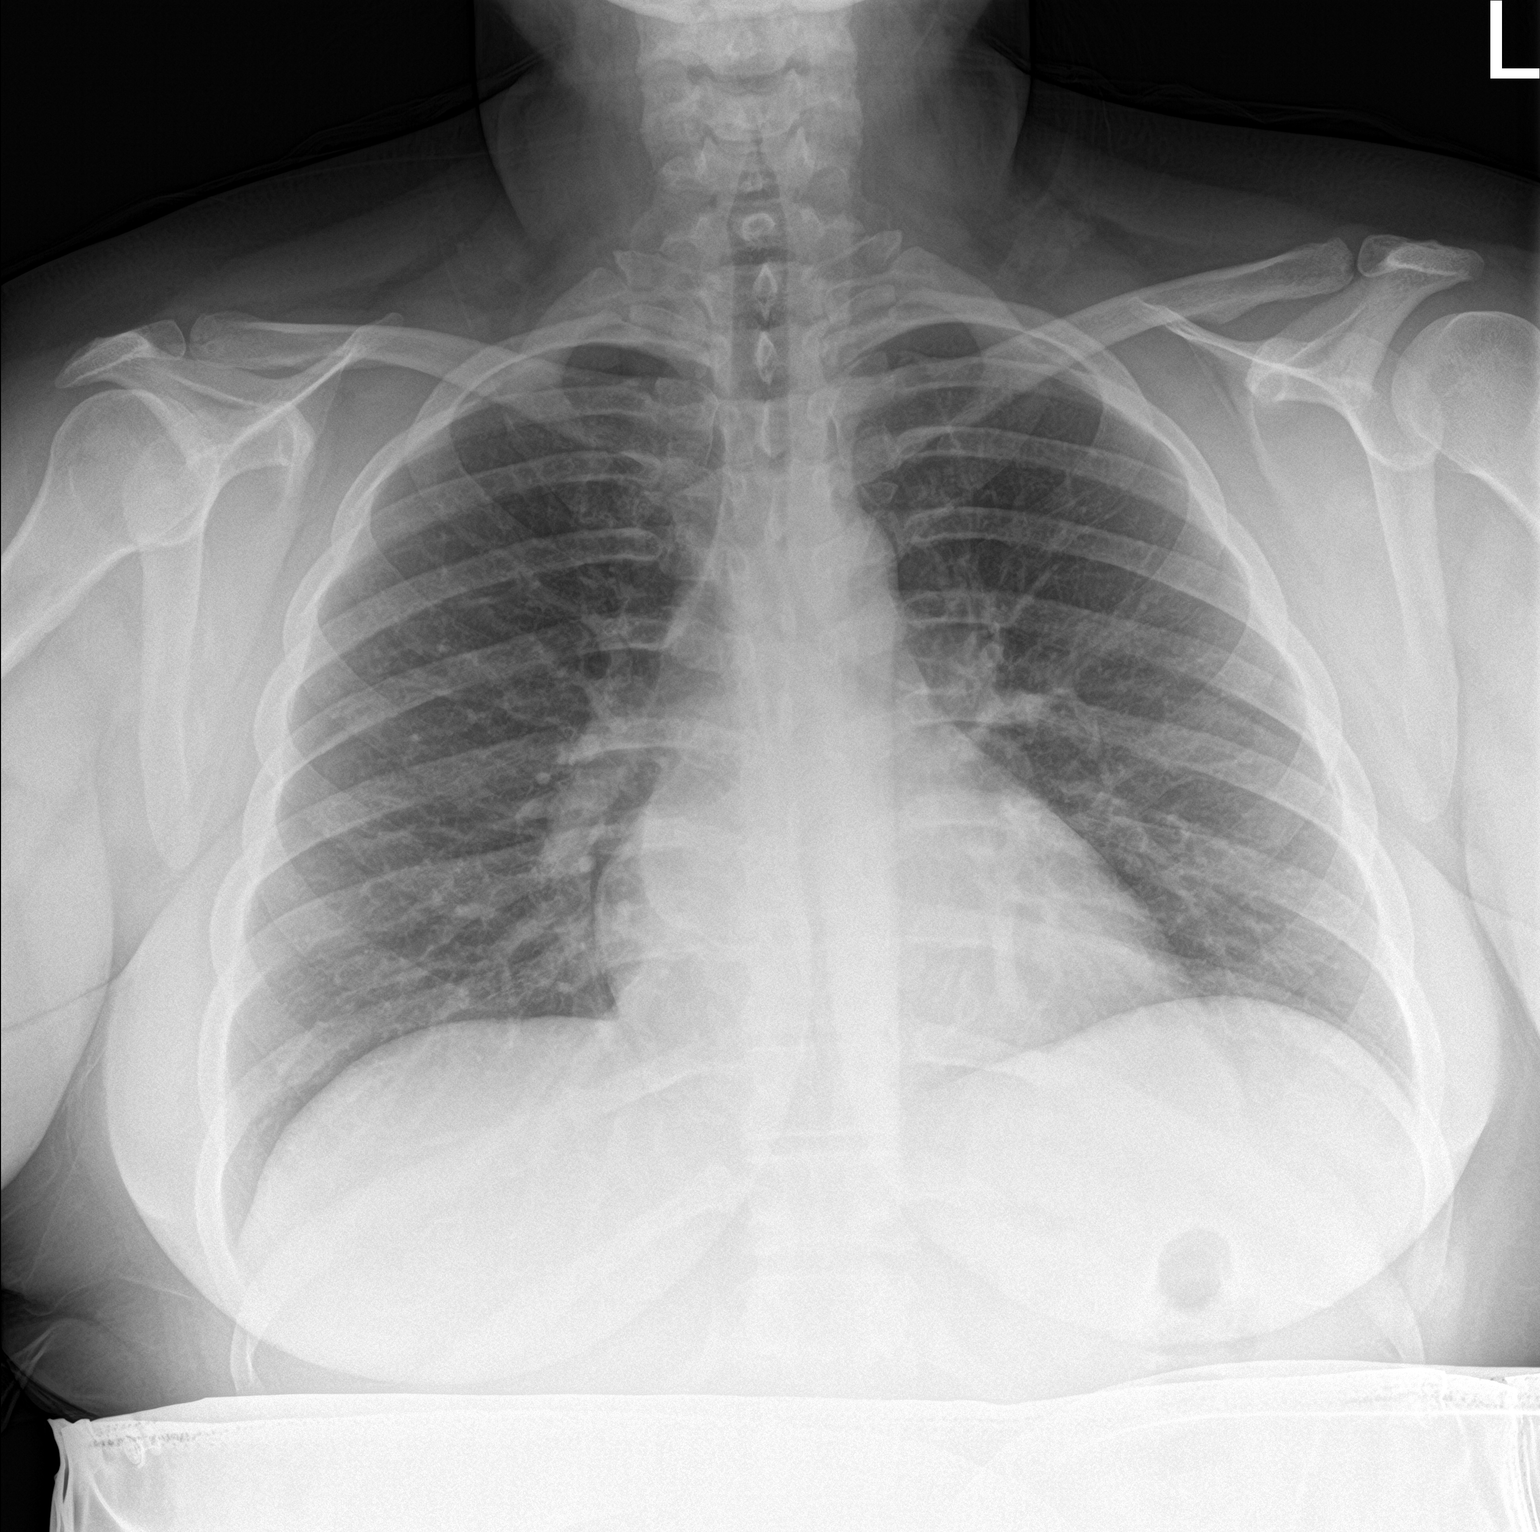

[chest lat]
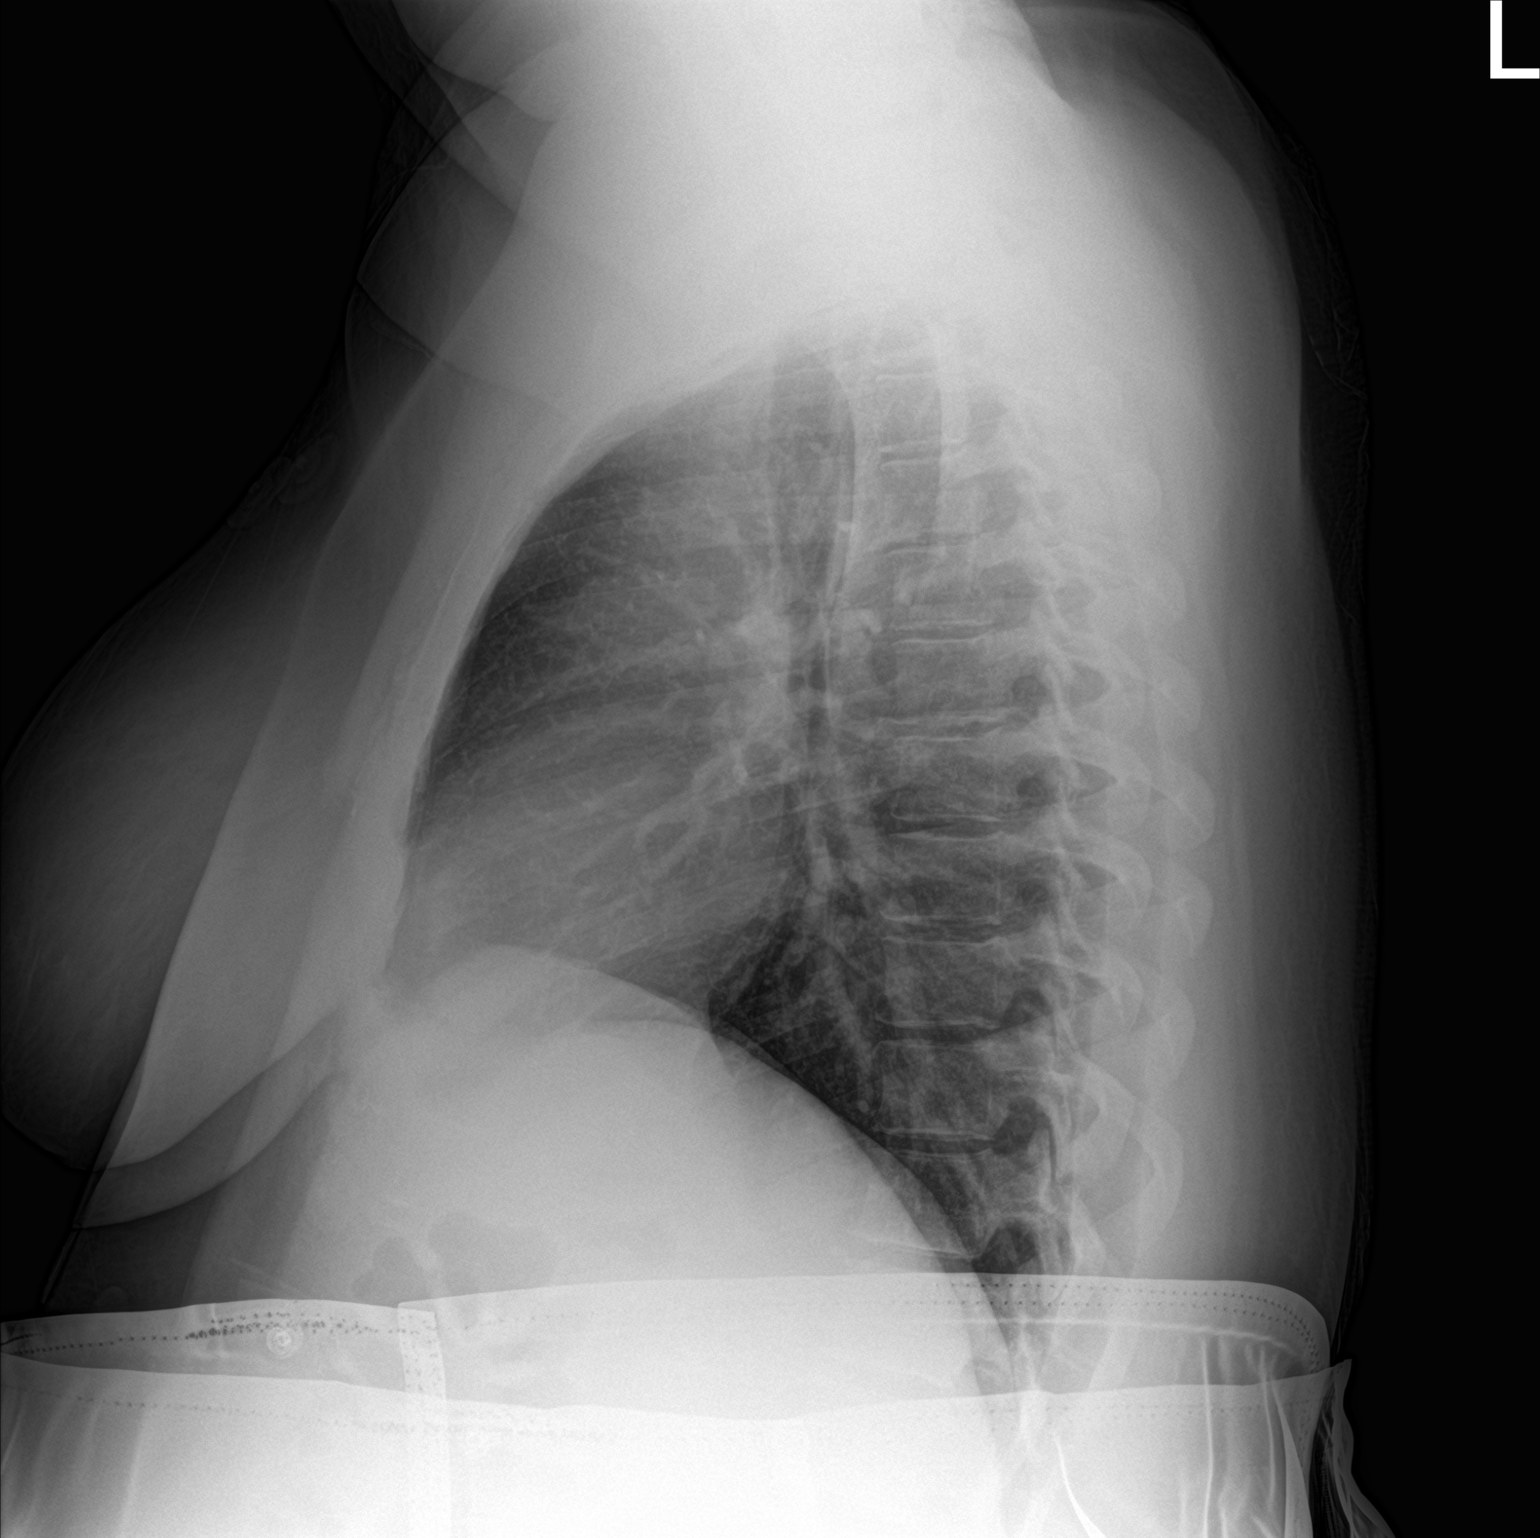

[2 of 2 positions shown; findings below may reference images not displayed]

FINDINGS: Lung volumes and mediastinal contours remain normal. Visualized
tracheal air column is within normal limits. Lung markings are
stable and within normal limits. Both lungs appear clear. Abdomen is
shielded. No acute osseous abnormality identified.
IMPRESSION: Negative.  No cardiopulmonary abnormality.

## 2023-02-10 ENCOUNTER — Ambulatory Visit: Payer: BC Managed Care – PPO | Attending: Obstetrics

## 2023-02-10 ENCOUNTER — Ambulatory Visit: Payer: BC Managed Care – PPO | Admitting: *Deleted

## 2023-02-10 VITALS — BP 119/77 | HR 90

## 2023-02-10 DIAGNOSIS — Z6791 Unspecified blood type, Rh negative: Secondary | ICD-10-CM | POA: Insufficient documentation

## 2023-02-10 DIAGNOSIS — O0990 Supervision of high risk pregnancy, unspecified, unspecified trimester: Secondary | ICD-10-CM

## 2023-02-10 DIAGNOSIS — E669 Obesity, unspecified: Secondary | ICD-10-CM

## 2023-02-10 DIAGNOSIS — O99213 Obesity complicating pregnancy, third trimester: Secondary | ICD-10-CM | POA: Insufficient documentation

## 2023-02-10 DIAGNOSIS — O360130 Maternal care for anti-D [Rh] antibodies, third trimester, not applicable or unspecified: Secondary | ICD-10-CM

## 2023-02-10 DIAGNOSIS — Z3A34 34 weeks gestation of pregnancy: Secondary | ICD-10-CM

## 2023-02-10 DIAGNOSIS — O3663X0 Maternal care for excessive fetal growth, third trimester, not applicable or unspecified: Secondary | ICD-10-CM

## 2023-02-10 DIAGNOSIS — O0933 Supervision of pregnancy with insufficient antenatal care, third trimester: Secondary | ICD-10-CM | POA: Insufficient documentation

## 2023-02-16 DIAGNOSIS — O3660X0 Maternal care for excessive fetal growth, unspecified trimester, not applicable or unspecified: Secondary | ICD-10-CM | POA: Insufficient documentation

## 2023-02-17 ENCOUNTER — Ambulatory Visit: Payer: BC Managed Care – PPO | Admitting: *Deleted

## 2023-02-17 ENCOUNTER — Other Ambulatory Visit: Payer: Self-pay | Admitting: *Deleted

## 2023-02-17 ENCOUNTER — Ambulatory Visit: Payer: BC Managed Care – PPO | Attending: Obstetrics

## 2023-02-17 VITALS — BP 129/73 | HR 80

## 2023-02-17 DIAGNOSIS — O0933 Supervision of pregnancy with insufficient antenatal care, third trimester: Secondary | ICD-10-CM | POA: Insufficient documentation

## 2023-02-17 DIAGNOSIS — O0990 Supervision of high risk pregnancy, unspecified, unspecified trimester: Secondary | ICD-10-CM | POA: Diagnosis not present

## 2023-02-17 DIAGNOSIS — O321XX0 Maternal care for breech presentation, not applicable or unspecified: Secondary | ICD-10-CM

## 2023-02-17 DIAGNOSIS — O99213 Obesity complicating pregnancy, third trimester: Secondary | ICD-10-CM | POA: Insufficient documentation

## 2023-02-17 DIAGNOSIS — O360130 Maternal care for anti-D [Rh] antibodies, third trimester, not applicable or unspecified: Secondary | ICD-10-CM

## 2023-02-17 DIAGNOSIS — Z6791 Unspecified blood type, Rh negative: Secondary | ICD-10-CM | POA: Insufficient documentation

## 2023-02-17 DIAGNOSIS — O3663X0 Maternal care for excessive fetal growth, third trimester, not applicable or unspecified: Secondary | ICD-10-CM | POA: Diagnosis not present

## 2023-02-17 DIAGNOSIS — E669 Obesity, unspecified: Secondary | ICD-10-CM

## 2023-02-17 DIAGNOSIS — Z3A35 35 weeks gestation of pregnancy: Secondary | ICD-10-CM

## 2023-02-19 ENCOUNTER — Other Ambulatory Visit: Payer: Self-pay | Admitting: *Deleted

## 2023-02-19 ENCOUNTER — Other Ambulatory Visit (HOSPITAL_COMMUNITY)
Admission: RE | Admit: 2023-02-19 | Discharge: 2023-02-19 | Disposition: A | Discharge status: Home / Self Care | Payer: BC Managed Care – PPO | Source: Ambulatory Visit | Attending: Family Medicine | Admitting: Family Medicine

## 2023-02-19 ENCOUNTER — Ambulatory Visit (INDEPENDENT_AMBULATORY_CARE_PROVIDER_SITE_OTHER): Payer: BC Managed Care – PPO | Admitting: Family Medicine

## 2023-02-19 VITALS — BP 129/80 | HR 91 | Wt 324.0 lb

## 2023-02-19 DIAGNOSIS — Z1339 Encounter for screening examination for other mental health and behavioral disorders: Secondary | ICD-10-CM

## 2023-02-19 DIAGNOSIS — Z3A36 36 weeks gestation of pregnancy: Secondary | ICD-10-CM

## 2023-02-19 DIAGNOSIS — O99213 Obesity complicating pregnancy, third trimester: Secondary | ICD-10-CM

## 2023-02-19 DIAGNOSIS — O0990 Supervision of high risk pregnancy, unspecified, unspecified trimester: Secondary | ICD-10-CM

## 2023-02-19 DIAGNOSIS — O0993 Supervision of high risk pregnancy, unspecified, third trimester: Secondary | ICD-10-CM

## 2023-02-19 DIAGNOSIS — O3663X0 Maternal care for excessive fetal growth, third trimester, not applicable or unspecified: Secondary | ICD-10-CM

## 2023-02-19 NOTE — Progress Notes (Signed)
   PRENATAL VISIT NOTE  Subjective:  Judith Ruiz is a 32 y.o. G2P1001 at [redacted]w[redacted]d being seen today for ongoing prenatal care.  She is currently monitored for the following issues for this high-risk pregnancy and has Obesity; RhD negative; Supervision of high risk pregnancy, antepartum; Late prenatal care affecting pregnancy in third trimester; and LGA (large for gestational age) fetus affecting management of mother on their problem list.  Patient reports no complaints.  Contractions: Not present. Vag. Bleeding: None.  Movement: Present. Denies leaking of fluid.   The following portions of the patient's history were reviewed and updated as appropriate: allergies, current medications, past family history, past medical history, past social history, past surgical history and problem list.   Objective:   Vitals:   02/19/23 0836  BP: 129/80  Pulse: 91  Weight: (!) 324 lb (147 kg)    Fetal Status: Fetal Heart Rate (bpm): 143   Movement: Present  Presentation: Vertex  General:  Alert, oriented and cooperative. Patient is in no acute distress.  Skin: Skin is warm and dry. No rash noted.   Cardiovascular: Normal heart rate noted  Respiratory: Normal respiratory effort, no problems with respiration noted  Abdomen: Soft, gravid, appropriate for gestational age.  Pain/Pressure: Present     Pelvic: Cervical exam deferred        Extremities: Normal range of motion.  Edema: Trace  Mental Status: Normal mood and affect. Normal behavior. Normal judgment and thought content.   Assessment and Plan:  Pregnancy: G2P1001 at [redacted]w[redacted]d 1. Supervision of high risk pregnancy, antepartum FHT normal - Culture, beta strep (group b only) - GC/Chlamydia probe amp ([place])not at [facility]  2. Excessive fetal growth affecting management of pregnancy in third trimester, single or unspecified fetus EFW 97% Normal 2hr GTT  3. [redacted] weeks gestation of pregnancy  - Culture, beta strep (group b only) -  GC/Chlamydia probe amp ([place])not at [facility]  Preterm labor symptoms and general obstetric precautions including but not limited to vaginal bleeding, contractions, leaking of fluid and fetal movement were reviewed in detail with the patient. Please refer to After Visit Summary for other counseling recommendations.   No follow-ups on file.  Future Appointments  Date Time Provider Department Center  02/23/2023  1:30 PM [facility] NURSE [facility]  02/23/2023  1:45 PM [facility] US4 [facility]  02/26/2023  8:35 AM Levie Heritage, DO [facility] None  03/03/2023  1:45 PM [facility] US6 [facility] [place]  03/05/2023  8:15 AM Levie Heritage, DO [facility] None  03/13/2023  8:55 AM Anyanwu, Jethro Bastos, MD [facility] None    Levie Heritage, DO

## 2023-02-20 LAB — GC/CHLAMYDIA PROBE AMP (~~LOC~~) NOT AT ARMC
Chlamydia: NEGATIVE
Comment: NEGATIVE
Comment: NORMAL
Neisseria Gonorrhea: NEGATIVE

## 2023-02-23 ENCOUNTER — Ambulatory Visit: Payer: BC Managed Care – PPO

## 2023-02-23 ENCOUNTER — Ambulatory Visit: Payer: BC Managed Care – PPO | Attending: Obstetrics | Admitting: *Deleted

## 2023-02-23 ENCOUNTER — Encounter: Payer: Self-pay | Admitting: *Deleted

## 2023-02-23 ENCOUNTER — Ambulatory Visit (HOSPITAL_BASED_OUTPATIENT_CLINIC_OR_DEPARTMENT_OTHER): Payer: BC Managed Care – PPO

## 2023-02-23 ENCOUNTER — Other Ambulatory Visit: Payer: Self-pay | Admitting: *Deleted

## 2023-02-23 VITALS — BP 140/80 | HR 90

## 2023-02-23 DIAGNOSIS — O321XX0 Maternal care for breech presentation, not applicable or unspecified: Secondary | ICD-10-CM

## 2023-02-23 DIAGNOSIS — O99213 Obesity complicating pregnancy, third trimester: Secondary | ICD-10-CM

## 2023-02-23 DIAGNOSIS — O3663X0 Maternal care for excessive fetal growth, third trimester, not applicable or unspecified: Secondary | ICD-10-CM

## 2023-02-23 DIAGNOSIS — O0933 Supervision of pregnancy with insufficient antenatal care, third trimester: Secondary | ICD-10-CM

## 2023-02-23 DIAGNOSIS — Z3A36 36 weeks gestation of pregnancy: Secondary | ICD-10-CM | POA: Diagnosis not present

## 2023-02-23 DIAGNOSIS — E669 Obesity, unspecified: Secondary | ICD-10-CM

## 2023-02-23 DIAGNOSIS — O0990 Supervision of high risk pregnancy, unspecified, unspecified trimester: Secondary | ICD-10-CM

## 2023-02-23 DIAGNOSIS — Z6791 Unspecified blood type, Rh negative: Secondary | ICD-10-CM

## 2023-02-23 DIAGNOSIS — O360130 Maternal care for anti-D [Rh] antibodies, third trimester, not applicable or unspecified: Secondary | ICD-10-CM

## 2023-02-23 DIAGNOSIS — O99210 Obesity complicating pregnancy, unspecified trimester: Secondary | ICD-10-CM

## 2023-02-23 LAB — CULTURE, BETA STREP (GROUP B ONLY): Strep Gp B Culture: NEGATIVE

## 2023-02-26 ENCOUNTER — Ambulatory Visit (INDEPENDENT_AMBULATORY_CARE_PROVIDER_SITE_OTHER): Payer: BC Managed Care – PPO | Admitting: Family Medicine

## 2023-02-26 VITALS — BP 121/87 | HR 90 | Wt 326.0 lb

## 2023-02-26 DIAGNOSIS — O3660X0 Maternal care for excessive fetal growth, unspecified trimester, not applicable or unspecified: Secondary | ICD-10-CM

## 2023-02-26 DIAGNOSIS — O0933 Supervision of pregnancy with insufficient antenatal care, third trimester: Secondary | ICD-10-CM

## 2023-02-26 DIAGNOSIS — Z6791 Unspecified blood type, Rh negative: Secondary | ICD-10-CM

## 2023-02-26 DIAGNOSIS — O0990 Supervision of high risk pregnancy, unspecified, unspecified trimester: Secondary | ICD-10-CM

## 2023-02-26 NOTE — Progress Notes (Signed)
   PRENATAL VISIT NOTE  Subjective:  Judith Ruiz is a 32 y.o. G2P1001 at [redacted]w[redacted]d being seen today for ongoing prenatal care.  She is currently monitored for the following issues for this high-risk pregnancy and has Obesity; RhD negative; Supervision of high risk pregnancy, antepartum; Late prenatal care affecting pregnancy in third trimester; and LGA (large for gestational age) fetus affecting management of mother on their problem list.  Patient reports no complaints.  Contractions: Not present. Vag. Bleeding: None.  Movement: Present. Denies leaking of fluid.   The following portions of the patient's history were reviewed and updated as appropriate: allergies, current medications, past family history, past medical history, past social history, past surgical history and problem list.   Objective:   Vitals:   02/26/23 0843  BP: 121/87  Pulse: 90  Weight: (!) 326 lb (147.9 kg)    Fetal Status: Fetal Heart Rate (bpm): 140   Movement: Present     General:  Alert, oriented and cooperative. Patient is in no acute distress.  Skin: Skin is warm and dry. No rash noted.   Cardiovascular: Normal heart rate noted  Respiratory: Normal respiratory effort, no problems with respiration noted  Abdomen: Soft, gravid, appropriate for gestational age.  Pain/Pressure: Present (pressure lower abd)     Pelvic: Cervical exam deferred        Extremities: Normal range of motion.  Edema: Trace  Mental Status: Normal mood and affect. Normal behavior. Normal judgment and thought content.   Assessment and Plan:  Pregnancy: G2P1001 at [redacted]w[redacted]d 1. Supervision of high risk pregnancy, antepartum FHT normal  2. RhD negative  3. Excessive fetal growth affecting management of pregnancy, antepartum, single or unspecified fetus MFM recommends delivery at 39 weeks. I discussed with patient risks to baby and to delivery - increased risk of stillbirth, increased risk of shoulder dystocia, significant lacerations,  cesarean delivery. She is hesitant about induction at 39 weeks. Wants to wait and see what his growth is, then make a decision.  4. Late prenatal care affecting pregnancy in third trimester  Term labor symptoms and general obstetric precautions including but not limited to vaginal bleeding, contractions, leaking of fluid and fetal movement were reviewed in detail with the patient. Please refer to After Visit Summary for other counseling recommendations.   No follow-ups on file.  Future Appointments  Date Time Provider Department Center  03/03/2023  1:45 PM [facility] US6 [facility]  03/05/2023  8:15 AM Levie Heritage, DO [facility] None  03/13/2023  8:55 AM Anyanwu, Jethro Bastos, MD [facility] None    Levie Heritage, DO

## 2023-03-03 ENCOUNTER — Ambulatory Visit: Payer: BC Managed Care – PPO | Attending: Obstetrics and Gynecology

## 2023-03-03 ENCOUNTER — Other Ambulatory Visit: Payer: Self-pay | Admitting: *Deleted

## 2023-03-03 DIAGNOSIS — O321XX0 Maternal care for breech presentation, not applicable or unspecified: Secondary | ICD-10-CM

## 2023-03-03 DIAGNOSIS — O3663X0 Maternal care for excessive fetal growth, third trimester, not applicable or unspecified: Secondary | ICD-10-CM

## 2023-03-03 DIAGNOSIS — E669 Obesity, unspecified: Secondary | ICD-10-CM

## 2023-03-03 DIAGNOSIS — O99213 Obesity complicating pregnancy, third trimester: Secondary | ICD-10-CM | POA: Diagnosis present

## 2023-03-03 DIAGNOSIS — Z3A37 37 weeks gestation of pregnancy: Secondary | ICD-10-CM

## 2023-03-03 DIAGNOSIS — O99210 Obesity complicating pregnancy, unspecified trimester: Secondary | ICD-10-CM | POA: Diagnosis present

## 2023-03-03 DIAGNOSIS — O0933 Supervision of pregnancy with insufficient antenatal care, third trimester: Secondary | ICD-10-CM

## 2023-03-03 DIAGNOSIS — O360130 Maternal care for anti-D [Rh] antibodies, third trimester, not applicable or unspecified: Secondary | ICD-10-CM | POA: Diagnosis not present

## 2023-03-05 ENCOUNTER — Ambulatory Visit (INDEPENDENT_AMBULATORY_CARE_PROVIDER_SITE_OTHER): Payer: BC Managed Care – PPO | Admitting: Family Medicine

## 2023-03-05 VITALS — BP 137/85 | HR 96 | Wt 329.0 lb

## 2023-03-05 DIAGNOSIS — Z6791 Unspecified blood type, Rh negative: Secondary | ICD-10-CM

## 2023-03-05 DIAGNOSIS — Z3A38 38 weeks gestation of pregnancy: Secondary | ICD-10-CM

## 2023-03-05 DIAGNOSIS — O0990 Supervision of high risk pregnancy, unspecified, unspecified trimester: Secondary | ICD-10-CM

## 2023-03-05 DIAGNOSIS — O3660X0 Maternal care for excessive fetal growth, unspecified trimester, not applicable or unspecified: Secondary | ICD-10-CM

## 2023-03-05 NOTE — Progress Notes (Signed)
   PRENATAL VISIT NOTE  Subjective:  Judith Ruiz is a 32 y.o. G2P1001 at [redacted]w[redacted]d being seen today for ongoing prenatal care.  She is currently monitored for the following issues for this high-risk pregnancy and has Obesity; RhD negative; Supervision of high risk pregnancy, antepartum; Late prenatal care affecting pregnancy in third trimester; and LGA (large for gestational age) fetus affecting management of mother on their problem list.  Patient reports no complaints.  Contractions: Not present. Vag. Bleeding: None.  Movement: Present. Denies leaking of fluid.   The following portions of the patient's history were reviewed and updated as appropriate: allergies, current medications, past family history, past medical history, past social history, past surgical history and problem list.   Objective:   Vitals:   03/05/23 0827  BP: 137/85  Pulse: 96  Weight: (!) 329 lb (149.2 kg)    Fetal Status: Fetal Heart Rate (bpm): 134   Movement: Present     General:  Alert, oriented and cooperative. Patient is in no acute distress.  Skin: Skin is warm and dry. No rash noted.   Cardiovascular: Normal heart rate noted  Respiratory: Normal respiratory effort, no problems with respiration noted  Abdomen: Soft, gravid, appropriate for gestational age.  Pain/Pressure: Present     Pelvic: Cervical exam deferred        Extremities: Normal range of motion.  Edema: Trace  Mental Status: Normal mood and affect. Normal behavior. Normal judgment and thought content.   Assessment and Plan:  Pregnancy: G2P1001 at [redacted]w[redacted]d 1. [redacted] weeks gestation of pregnancy  2. Supervision of high risk pregnancy, antepartum FHT normal Vertex on [place] today  3. Excessive fetal growth affecting management of pregnancy, antepartum, single or unspecified fetus MFM recommends induction at 39 weeks  4. RhD negative Rhogam  Term labor symptoms and general obstetric precautions including but not limited to vaginal bleeding,  contractions, leaking of fluid and fetal movement were reviewed in detail with the patient. Please refer to After Visit Summary for other counseling recommendations.   No follow-ups on file.  Future Appointments  Date Time Provider Department Center  03/10/2023  8:30 AM [facility] US4 [facility]  03/13/2023  6:45 AM [facility] SCHED ROOM [facility] None  04/23/2023  9:55 AM Levie Heritage, DO [facility] None    Levie Heritage, DO

## 2023-03-06 ENCOUNTER — Other Ambulatory Visit: Payer: Self-pay | Admitting: Advanced Practice Midwife

## 2023-03-06 ENCOUNTER — Telehealth (HOSPITAL_COMMUNITY): Payer: Self-pay | Admitting: *Deleted

## 2023-03-06 ENCOUNTER — Encounter (HOSPITAL_COMMUNITY): Payer: Self-pay

## 2023-03-06 NOTE — Telephone Encounter (Signed)
Preadmission screen  

## 2023-03-09 ENCOUNTER — Telehealth (HOSPITAL_COMMUNITY): Payer: Self-pay | Admitting: *Deleted

## 2023-03-09 NOTE — Telephone Encounter (Signed)
Preadmission screen  

## 2023-03-10 ENCOUNTER — Ambulatory Visit (HOSPITAL_BASED_OUTPATIENT_CLINIC_OR_DEPARTMENT_OTHER): Payer: BC Managed Care – PPO

## 2023-03-10 DIAGNOSIS — O99213 Obesity complicating pregnancy, third trimester: Secondary | ICD-10-CM

## 2023-03-10 DIAGNOSIS — O321XX0 Maternal care for breech presentation, not applicable or unspecified: Secondary | ICD-10-CM | POA: Diagnosis not present

## 2023-03-10 DIAGNOSIS — O0933 Supervision of pregnancy with insufficient antenatal care, third trimester: Secondary | ICD-10-CM | POA: Diagnosis not present

## 2023-03-10 DIAGNOSIS — O360130 Maternal care for anti-D [Rh] antibodies, third trimester, not applicable or unspecified: Secondary | ICD-10-CM

## 2023-03-10 DIAGNOSIS — Z3A38 38 weeks gestation of pregnancy: Secondary | ICD-10-CM

## 2023-03-10 DIAGNOSIS — E669 Obesity, unspecified: Secondary | ICD-10-CM

## 2023-03-10 DIAGNOSIS — O3663X0 Maternal care for excessive fetal growth, third trimester, not applicable or unspecified: Secondary | ICD-10-CM | POA: Insufficient documentation

## 2023-03-11 ENCOUNTER — Telehealth (HOSPITAL_COMMUNITY): Payer: Self-pay | Admitting: *Deleted

## 2023-03-11 ENCOUNTER — Encounter (HOSPITAL_COMMUNITY): Payer: Self-pay | Admitting: *Deleted

## 2023-03-11 ENCOUNTER — Other Ambulatory Visit: Payer: Self-pay | Admitting: Advanced Practice Midwife

## 2023-03-11 NOTE — Telephone Encounter (Signed)
Preadmission screen  

## 2023-03-12 ENCOUNTER — Encounter: Payer: Self-pay | Admitting: Obstetrics and Gynecology

## 2023-03-12 DIAGNOSIS — Z6841 Body Mass Index (BMI) 40.0 and over, adult: Secondary | ICD-10-CM | POA: Insufficient documentation

## 2023-03-13 ENCOUNTER — Inpatient Hospital Stay (HOSPITAL_COMMUNITY): Payer: BC Managed Care – PPO

## 2023-03-13 ENCOUNTER — Encounter (HOSPITAL_COMMUNITY): Payer: Self-pay | Admitting: Family Medicine

## 2023-03-13 ENCOUNTER — Other Ambulatory Visit: Payer: Self-pay

## 2023-03-13 ENCOUNTER — Encounter: Payer: BC Managed Care – PPO | Admitting: Obstetrics & Gynecology

## 2023-03-13 ENCOUNTER — Inpatient Hospital Stay (HOSPITAL_COMMUNITY)
Admission: RE | Admit: 2023-03-13 | Discharge: 2023-03-15 | DRG: 807 | Disposition: A | Discharge status: Home / Self Care | Payer: BC Managed Care – PPO | Attending: Obstetrics and Gynecology | Admitting: Obstetrics and Gynecology

## 2023-03-13 ENCOUNTER — Inpatient Hospital Stay (HOSPITAL_COMMUNITY): Payer: BC Managed Care – PPO | Admitting: Anesthesiology

## 2023-03-13 DIAGNOSIS — Z83438 Family history of other disorder of lipoprotein metabolism and other lipidemia: Secondary | ICD-10-CM

## 2023-03-13 DIAGNOSIS — O26893 Other specified pregnancy related conditions, third trimester: Secondary | ICD-10-CM | POA: Diagnosis present

## 2023-03-13 DIAGNOSIS — R000 Tachycardia, unspecified: Secondary | ICD-10-CM | POA: Diagnosis present

## 2023-03-13 DIAGNOSIS — O139 Gestational [pregnancy-induced] hypertension without significant proteinuria, unspecified trimester: Secondary | ICD-10-CM | POA: Insufficient documentation

## 2023-03-13 DIAGNOSIS — O3663X0 Maternal care for excessive fetal growth, third trimester, not applicable or unspecified: Principal | ICD-10-CM | POA: Diagnosis present

## 2023-03-13 DIAGNOSIS — O99892 Other specified diseases and conditions complicating childbirth: Secondary | ICD-10-CM | POA: Diagnosis present

## 2023-03-13 DIAGNOSIS — Z23 Encounter for immunization: Secondary | ICD-10-CM

## 2023-03-13 DIAGNOSIS — O99214 Obesity complicating childbirth: Secondary | ICD-10-CM | POA: Diagnosis present

## 2023-03-13 DIAGNOSIS — Z8249 Family history of ischemic heart disease and other diseases of the circulatory system: Secondary | ICD-10-CM

## 2023-03-13 DIAGNOSIS — O0990 Supervision of high risk pregnancy, unspecified, unspecified trimester: Secondary | ICD-10-CM

## 2023-03-13 DIAGNOSIS — O134 Gestational [pregnancy-induced] hypertension without significant proteinuria, complicating childbirth: Secondary | ICD-10-CM | POA: Diagnosis present

## 2023-03-13 DIAGNOSIS — Z3A39 39 weeks gestation of pregnancy: Secondary | ICD-10-CM

## 2023-03-13 DIAGNOSIS — O3660X0 Maternal care for excessive fetal growth, unspecified trimester, not applicable or unspecified: Secondary | ICD-10-CM

## 2023-03-13 DIAGNOSIS — Z3490 Encounter for supervision of normal pregnancy, unspecified, unspecified trimester: Principal | ICD-10-CM | POA: Diagnosis present

## 2023-03-13 DIAGNOSIS — Z6791 Unspecified blood type, Rh negative: Secondary | ICD-10-CM | POA: Diagnosis not present

## 2023-03-13 LAB — TYPE AND SCREEN
ABO/RH(D): O NEG
Antibody Screen: POSITIVE

## 2023-03-13 LAB — CBC
HCT: 33.8 % — ABNORMAL LOW (ref 36.0–46.0)
Hemoglobin: 10 g/dL — ABNORMAL LOW (ref 12.0–15.0)
MCH: 21.1 pg — ABNORMAL LOW (ref 26.0–34.0)
MCHC: 29.6 g/dL — ABNORMAL LOW (ref 30.0–36.0)
MCV: 71.3 fL — ABNORMAL LOW (ref 80.0–100.0)
Platelets: 295 10*3/uL (ref 150–400)
RBC: 4.74 MIL/uL (ref 3.87–5.11)
RDW: 18.4 % — ABNORMAL HIGH (ref 11.5–15.5)
WBC: 9 10*3/uL (ref 4.0–10.5)
nRBC: 0 % (ref 0.0–0.2)

## 2023-03-13 LAB — RPR: RPR Ser Ql: NONREACTIVE

## 2023-03-13 MED ORDER — LACTATED RINGERS IV SOLN
500.0000 mL | INTRAVENOUS | Status: DC | PRN
  Administered 2023-03-14 (×2): 500 mL via INTRAVENOUS

## 2023-03-13 MED ORDER — TERBUTALINE SULFATE 1 MG/ML IJ SOLN: 0.2500 mg | Freq: Once | INTRAMUSCULAR | Status: DC | PRN

## 2023-03-13 MED ORDER — FENTANYL-BUPIVACAINE-NACL 0.5-0.125-0.9 MG/250ML-% EP SOLN
12.0000 mL/h | EPIDURAL | Status: DC | PRN
  Administered 2023-03-13 – 2023-03-14 (×2): 12 mL/h via EPIDURAL
  Filled 2023-03-13 (×2): qty 250

## 2023-03-13 MED ORDER — PHENYLEPHRINE 80 MCG/ML (10ML) SYRINGE FOR IV PUSH (FOR BLOOD PRESSURE SUPPORT): 80.0000 ug | PREFILLED_SYRINGE | INTRAVENOUS | Status: DC | PRN

## 2023-03-13 MED ORDER — OXYTOCIN-SODIUM CHLORIDE 30-0.9 UT/500ML-% IV SOLN
2.5000 [IU]/h | INTRAVENOUS | Status: DC
  Administered 2023-03-14: 2.5 [IU]/h via INTRAVENOUS
  Filled 2023-03-13: qty 500

## 2023-03-13 MED ORDER — MISOPROSTOL 25 MCG QUARTER TABLET
25.0000 ug | ORAL_TABLET | Freq: Once | ORAL | Status: DC
  Filled 2023-03-13: qty 1

## 2023-03-13 MED ORDER — LACTATED RINGERS IV SOLN: INTRAVENOUS | Status: DC

## 2023-03-13 MED ORDER — EPHEDRINE 5 MG/ML INJ: 10.0000 mg | INTRAVENOUS | Status: DC | PRN

## 2023-03-13 MED ORDER — DIPHENHYDRAMINE HCL 50 MG/ML IJ SOLN: 12.5000 mg | INTRAMUSCULAR | Status: DC | PRN

## 2023-03-13 MED ORDER — OXYCODONE-ACETAMINOPHEN 5-325 MG PO TABS: 1.0000 | ORAL_TABLET | ORAL | Status: DC | PRN

## 2023-03-13 MED ORDER — MISOPROSTOL 25 MCG QUARTER TABLET: 25.0000 ug | ORAL_TABLET | ORAL | Status: DC | PRN

## 2023-03-13 MED ORDER — LACTATED RINGERS AMNIOINFUSION: INTRAVENOUS | Status: DC

## 2023-03-13 MED ORDER — LACTATED RINGERS IV SOLN: 500.0000 mL | Freq: Once | INTRAVENOUS | Status: DC

## 2023-03-13 MED ORDER — ONDANSETRON HCL 4 MG/2ML IJ SOLN: 4.0000 mg | Freq: Four times a day (QID) | INTRAMUSCULAR | Status: DC | PRN

## 2023-03-13 MED ORDER — LIDOCAINE-EPINEPHRINE (PF) 1.5 %-1:200000 IJ SOLN
INTRAMUSCULAR | Status: DC | PRN
  Administered 2023-03-13: 5 mL via EPIDURAL

## 2023-03-13 MED ORDER — OXYTOCIN-SODIUM CHLORIDE 30-0.9 UT/500ML-% IV SOLN
1.0000 m[IU]/min | INTRAVENOUS | Status: DC
  Administered 2023-03-13: 2 m[IU]/min via INTRAVENOUS
  Filled 2023-03-13: qty 500

## 2023-03-13 MED ORDER — ACETAMINOPHEN 325 MG PO TABS: 650.0000 mg | ORAL_TABLET | ORAL | Status: DC | PRN

## 2023-03-13 MED ORDER — OXYTOCIN BOLUS FROM INFUSION
333.0000 mL | Freq: Once | INTRAVENOUS | Status: AC
  Administered 2023-03-14: 333 mL via INTRAVENOUS

## 2023-03-13 MED ORDER — OXYCODONE-ACETAMINOPHEN 5-325 MG PO TABS: 2.0000 | ORAL_TABLET | ORAL | Status: DC | PRN

## 2023-03-13 MED ORDER — LIDOCAINE HCL (PF) 1 % IJ SOLN
30.0000 mL | INTRAMUSCULAR | Status: AC | PRN
  Administered 2023-03-14: 30 mL via SUBCUTANEOUS

## 2023-03-13 MED ORDER — SOD CITRATE-CITRIC ACID 500-334 MG/5ML PO SOLN: 30.0000 mL | ORAL | Status: DC | PRN

## 2023-03-13 MED ORDER — MISOPROSTOL 50MCG HALF TABLET
50.0000 ug | ORAL_TABLET | Freq: Once | ORAL | Status: AC
  Administered 2023-03-13: 50 ug via ORAL
  Filled 2023-03-13: qty 1

## 2023-03-13 NOTE — Anesthesia Procedure Notes (Signed)
Epidural Patient location during procedure: OB Start time: 03/13/2023 5:30 PM End time: 03/13/2023 5:40 PM  Staffing Anesthesiologist: Atilano Median, DO Performed: anesthesiologist   Preanesthetic Checklist Completed: patient identified, IV checked, site marked, risks and benefits discussed, surgical consent, monitors and equipment checked, pre-op evaluation and timeout performed  Epidural Patient position: sitting Prep: ChloraPrep Patient monitoring: heart rate, continuous pulse ox and blood pressure Approach: midline Location: L4-L5 Injection technique: LOR saline  Needle:  Needle type: Tuohy  Needle gauge: 17 G Needle length: 9 cm Needle insertion depth: 9 cm Catheter type: closed end flexible Catheter size: 20 Guage Catheter at skin depth: 15 cm Test dose: negative and 1.5% lidocaine with Epi 1:200 K  Assessment Events: blood not aspirated, no cerebrospinal fluid, injection not painful, no injection resistance and no paresthesia  Additional Notes Patient identified. Risks/Benefits/Options discussed with patient including but not limited to bleeding, infection, nerve damage, paralysis, failed block, incomplete pain control, headache, blood pressure changes, nausea, vomiting, reactions to medications, itching and postpartum back pain. Confirmed with bedside nurse the patient's most recent platelet count. Confirmed with patient that they are not currently taking any anticoagulation, have any bleeding history or any family history of bleeding disorders. Patient expressed understanding and wished to proceed. All questions were answered. Sterile technique was used throughout the entire procedure. Please see nursing notes for vital signs. Test dose was given through epidural catheter and negative prior to continuing to dose epidural or start infusion. Warning signs of high block given to the patient including shortness of breath, tingling/numbness in hands, complete motor block,  or any concerning symptoms with instructions to call for help. Patient was given instructions on fall risk and not to get out of bed. All questions and concerns addressed with instructions to call with any issues or inadequate analgesia.    Reason for block:procedure for pain

## 2023-03-13 NOTE — H&P (Cosign Needed Addendum)
OBSTETRIC ADMISSION HISTORY AND PHYSICAL  Judith Ruiz is a 32 y.o. female G2P1001 with IUP at [redacted]w[redacted]d by LMP presenting for IOL for LGA (EFW 97%  3924 gm @[redacted]w[redacted]d ). She reports +FMs, No LOF, no VB, no blurry vision, headaches or peripheral edema, and RUQ pain.  She plans on breast feeding. She request no birth control. She received her [facility]   Dating: By LMP --->  Estimated Date of Delivery: 03/19/23  Sono:   @37w5dw , CWD, normal anatomy, cephalic presentation,  3924g, 97% EFW   Prenatal History/Complications:  - LGA  - maternal obesity - RhD negative   Past Medical History: Past Medical History:  Diagnosis Date   Obesity     Past Surgical History: Past Surgical History:  Procedure Laterality Date   NO PAST SURGERIES      Obstetrical History: OB History     Gravida  2   Para  1   Term  1   Preterm      AB      Living  1      SAB      IAB      Ectopic      Multiple  0   Live Births  1           Social History Social History   Socioeconomic History   Marital status: Married    Spouse name: Not on file   Number of children: 1   Years of education: Not on file   Highest education level: Not on file  Occupational History   Not on file  Tobacco Use   Smoking status: Never   Smokeless tobacco: Never  Vaping Use   Vaping status: Never Used  Substance and Sexual Activity   Alcohol use: No    Alcohol/week: 0.0 standard drinks of alcohol   Drug use: No   Sexual activity: Yes    Birth control/protection: None  Other Topics Concern   Not on file  Social History Narrative   Lives locally w/ husband and daughter.  Works in an office - sedentary.   Social Determinants of Health   Financial Resource Strain: Not on file  Food Insecurity: No Food Insecurity (03/13/2023)   Hunger Vital Sign    Worried About Running Out of Food in the Last Year: Never true    Ran Out of Food in the Last Year: Never true   Transportation Needs: No Transportation Needs (03/13/2023)   PRAPARE - Administrator, Civil Service (Medical): No    Lack of Transportation (Non-Medical): No  Physical Activity: Not on file  Stress: Not on file  Social Connections: Not on file    Family History: Family History  Problem Relation Age of Onset   Hyperlipidemia Father    Coronary artery disease Paternal Grandmother    Coronary artery disease Paternal Grandfather     Allergies: No Known Allergies  Medications Prior to Admission  Medication Sig Dispense Refill Last Dose   Ferrous Sulfate (IRON) 325 (65 Fe) MG TABS Take by mouth.   Past Week   Multiple Vitamin (MULTIVITAMIN WITH MINERALS) TABS tablet Take 1 tablet by mouth daily.   Past Week     Review of Systems   All systems reviewed and negative except as stated in HPI  Blood pressure 124/74, pulse 94, temperature 98.4 F (36.9 C), temperature source Oral, resp. rate 18, height 5\' 5"  (1.651 m), weight (!) 149.7 kg, last menstrual period 06/12/2022,  unknown if currently breastfeeding. General appearance: alert Lungs: clear to auscultation bilaterally Heart: regular rate and rhythm Abdomen: soft, non-tender; bowel sounds normal Extremities: Homans sign is negative, no sign of DVT Presentation: cephalic Fetal monitoringBaseline: 135 bpm, Variability: Good {> 6 bpm), Accelerations: Reactive, and Decelerations: Absent Uterine activityNone Dilation: 1.5 Effacement (%): 70 Station: Ballotable Exam by:: Judith Ruiz, RNC  Prenatal labs: ABO, Rh: --/--/PENDING (09/13 9629) Antibody: PENDING (09/13 0917) Rubella: 1.54 (06/14 0940) RPR: Non Reactive (06/25 0921)  HBsAg: Negative (06/14 0940)  HIV: Non Reactive (06/25 0921)  GBS: Negative/-- (08/22 0913)  1 hr Glucola wnl Genetic screening  declined Anatomy US LGA, normal anatomy   Prenatal Transfer Tool  Maternal Diabetes: No Genetic Screening: Declined Maternal Ultrasounds/Referrals:  LGA Fetal Ultrasounds or other Referrals:  Referred to Materal Fetal Medicine  Maternal Substance Abuse:  No Significant Maternal Medications:  None Significant Maternal Lab Results:  Rh negative, GBS neg Number of Prenatal Visits:greater than 3 verified prenatal visits Other Comments:  None  Results for orders placed or performed during the hospital encounter of 03/13/23 (from the past 24 hour(s))  CBC   Collection Time: 03/13/23  9:00 AM  Result Value Ref Range   WBC 9.0 4.0 - 10.5 K/uL   RBC 4.74 3.87 - 5.11 MIL/uL   Hemoglobin 10.0 (L) 12.0 - 15.0 g/dL   HCT 52.8 (L) 41.3 - 24.4 %   MCV 71.3 (L) 80.0 - 100.0 fL   MCH 21.1 (L) 26.0 - 34.0 pg   MCHC 29.6 (L) 30.0 - 36.0 g/dL   RDW 01.0 (H) 27.2 - 53.6 %   Platelets 295 150 - 400 K/uL   nRBC 0.0 0.0 - 0.2 %  Type and screen   Collection Time: 03/13/23  9:17 AM  Result Value Ref Range   ABO/RH(D) PENDING    Antibody Screen PENDING    Sample Expiration      03/16/2023,2359 Performed at [facility], [address], [place], [place] [address]     Patient Active Problem List   Diagnosis Date Noted   Encounter for induction of labor 03/13/2023   BMI 50.0-59.9, adult (HCC) 03/12/2023   LGA (large for gestational age) fetus affecting management of mother 02/16/2023   Supervision of high risk pregnancy, antepartum 12/12/2022   Late prenatal care affecting pregnancy in third trimester 12/12/2022   RhD negative 02/06/2022   Obesity in pregnancy 01/04/2015    Assessment/Plan:  Judith Ruiz is a 32 y.o. G2P1001 at [redacted]w[redacted]d here for IOL 2/2 to LGA (3924g, 97% @[redacted]w[redacted]d ) and maternal obesity.   #Labor: Patient SROM @ [phone] with light mec. Given oral cytotec. Will augment as able.  #Pain: Nitrous, epidural if needed  #FWB: Cat 1  #ID:  GBS neg #MOF: breast #MOC:declines  #Circ:  No  GME ATTESTATION:  Evaluation and management procedures were performed by the Family Medicine Resident under my supervision. I was  immediately available for direct supervision, assistance and direction throughout this encounter.  I also confirm that I have verified the information documented in the resident's note, and that I have also personally reperformed the pertinent components of the physical exam and all of the medical decision making activities.  I have also made any necessary editorial changes.  Wyn Forster, MD OB Fellow, Faculty [facility], Center for [facility] 03/13/2023 10:58 AM

## 2023-03-13 NOTE — Progress Notes (Signed)
Labor Progress Note  Judith Ruiz is a 32 y.o. G2P1001 at [redacted]w[redacted]d presented for  IOL given LGA (EFW 97% 3924g @[redacted]w[redacted]d )   S: patient comfortably per nursing.  Baby with variables recurrently despite different positioning.   O:  BP (!) 127/56   Pulse (!) 103   Temp 98.7 F (37.1 C) (Oral)   Resp 16   Ht 5\' 5"  (1.651 m)   Wt (!) 149.7 kg   LMP 06/12/2022 (Approximate)   SpO2 99%   BMI 54.91 kg/m  EFM:140bpm/Mild variability/ 10x10 accels/ Variable decels CAT: 2 Toco: q50min   CVE: Dilation: 6.5 Effacement (%): 80 Station: -3 Presentation: Vertex Exam by:: Olivia Mackie RN   A&P: 32 y.o. G2P1001 [redacted]w[redacted]d  here for IOL as above  #Labor: Progressing well. Given variables will re-initiate amnioinfusion at former protocol, including bolus.   Last two CTX s/p start of amnioinfusion showed better variability as well as no decels.  #Pain: Epidural #FWB: CAT 2, improving with amnioinfusion #GBS negative  #BP: max SBP 159, DBP 87, mild headache. All improved after epidural.    [name], MD FMOB Fellow, Faculty practice [facility], Center for [facility] 03/13/23  11:08 PM

## 2023-03-13 NOTE — Progress Notes (Signed)
Labor Progress Note Judith Ruiz is a 32 y.o. G2P1001 at [redacted]w[redacted]d presented for IOL for LGA (EFW 97%  3924 gm @[redacted]w[redacted]d ).  S: comfortable with epidural  O:  BP 118/65   Pulse 84   Temp 98.3 F (36.8 C) (Oral)   Resp 16   Ht 5\' 5"  (1.651 m)   Wt (!) 149.7 kg   LMP 06/12/2022 (Approximate)   SpO2 99%   BMI 54.91 kg/m  EFM: 135/moderate variability/accels present/no decels  CVE: Dilation: 4.5 Effacement (%): 60 Station: -3 Presentation: Vertex Exam by:: Judith Ruiz, RNC   A&P: 32 y.o. G2P1001 [redacted]w[redacted]d IOL for LGA (EFW 97%  3924 gm @[redacted]w[redacted]d ).  #Labor: Progressing well. Amnioinfusion discontinued. IUPC remains.  #Pain: Epidural #FWB: Cat I #GBS negative  #BP: max SBP 159, DBP 87, mild headache. All improved after epidural.   Wyn Forster, MD 6:23 PM

## 2023-03-13 NOTE — Progress Notes (Signed)
Labor Progress Note  Judith Ruiz is a 32 y.o. G2P1001 at [redacted]w[redacted]d presented for IOL given LGA (EFW 97% 3924g @[redacted]w[redacted]d )  S: resting comfortably, no concerns  O:  BP 118/63   Pulse 83   Temp 98.3 F (36.8 C) (Oral)   Resp 16   Ht 5\' 5"  (1.651 m)   Wt (!) 149.7 kg   LMP 06/12/2022 (Approximate)   SpO2 99%   BMI 54.91 kg/m  EFM:135bpm/Mild variability/ 15x15 accels/ None decels CAT: 1 Toco: q2-36mins    CVE: Dilation: 4.5 Effacement (%): 60 Station: -3 Presentation: Vertex Exam by:: Lorn Junes, RNC   A&P: 32 y.o. G2P1001 [redacted]w[redacted]d  here for IOL as above  #Labor: Progressing well. Due for check at 2200.  #Pain: Epidural #FWB: CAT 1 #GBS negative  #BP: max SBP 159, DBP 87, mild headache. All improved after epidural.   [name], MD FMOB Fellow, Faculty practice [facility], Center for [facility] 03/13/23  8:56 PM

## 2023-03-13 NOTE — Anesthesia Preprocedure Evaluation (Signed)
Anesthesia Evaluation  Patient identified by MRN, date of birth, ID band Patient awake    Reviewed: Allergy & Precautions, Patient's Chart, lab work & pertinent test results  Airway Mallampati: II  TM Distance: >3 FB Neck ROM: Full    Dental no notable dental hx.    Pulmonary neg pulmonary ROS   Pulmonary exam normal        Cardiovascular negative cardio ROS  Rhythm:Regular Rate:Normal     Neuro/Psych negative neurological ROS  negative psych ROS   GI/Hepatic negative GI ROS, Neg liver ROS,,,  Endo/Other    Morbid obesity  Renal/GU negative Renal ROS  negative genitourinary   Musculoskeletal   Abdominal Normal abdominal exam  (+)   Peds  Hematology Lab Results      Component                Value               Date                      WBC                      9.0                 03/13/2023                HGB                      10.0 (L)            03/13/2023                HCT                      33.8 (L)            03/13/2023                MCV                      71.3 (L)            03/13/2023                PLT                      295                 03/13/2023             Lab Results      Component                Value               Date                      NA                       141                 12/23/2022                K                        4.3  12/23/2022                CO2                      18 (L)              12/23/2022                GLUCOSE                  70                  12/23/2022                BUN                      6                   12/23/2022                CREATININE               0.54 (L)            12/23/2022                CALCIUM                  8.9                 12/23/2022                EGFR                     127                 12/23/2022                GFRNONAA                 >60                 06/21/2021              Anesthesia Other  Findings   Reproductive/Obstetrics (+) Pregnancy                             Anesthesia Physical Anesthesia Plan  ASA: 3  Anesthesia Plan: Epidural   Post-op Pain Management:    Induction:   PONV Risk Score and Plan: 2 and Treatment may vary due to age or medical condition  Airway Management Planned: Natural Airway  Additional Equipment: None  Intra-op Plan:   Post-operative Plan:   Informed Consent: I have reviewed the patients History and Physical, chart, labs and discussed the procedure including the risks, benefits and alternatives for the proposed anesthesia with the patient or authorized representative who has indicated his/her understanding and acceptance.     Dental advisory given  Plan Discussed with:   Anesthesia Plan Comments:        Anesthesia Quick Evaluation

## 2023-03-13 NOTE — Progress Notes (Signed)
Labor Progress Note Judith Ruiz is a 32 y.o. G2P1001 at [redacted]w[redacted]d presented for IOL for LGA (EFW 97%  3924 gm @[redacted]w[redacted]d ).  S: Coping well with labor. Discussed risk and benefits of IUPC and amnioinfusion, agreeable.   O:  BP 118/65   Pulse 84   Temp 98.3 F (36.8 C) (Oral)   Resp 16   Ht 5\' 5"  (1.651 m)   Wt (!) 149.7 kg   LMP 06/12/2022 (Approximate)   SpO2 99%   BMI 54.91 kg/m  EFM: 145/moderate variability/accels present/intermittent lates  CVE: Dilation: 3 Effacement (%): 50 Station: -3 Presentation: Vertex (verified via bedside US) Exam by:: Pietro Cassis, RN   A&P: 32 y.o. G2P1001 [redacted]w[redacted]d admitted for IOL for LGA (EFW 97%  3924 gm @[redacted]w[redacted]d ).  #Labor: Progressing well.  #Pain: Coping well #FWB: Cat II, improved with amnioinfusion #GBS negative   Wyn Forster, MD 6:13 PM

## 2023-03-14 ENCOUNTER — Encounter (HOSPITAL_COMMUNITY): Payer: Self-pay | Admitting: Family Medicine

## 2023-03-14 DIAGNOSIS — O3663X0 Maternal care for excessive fetal growth, third trimester, not applicable or unspecified: Secondary | ICD-10-CM

## 2023-03-14 DIAGNOSIS — O139 Gestational [pregnancy-induced] hypertension without significant proteinuria, unspecified trimester: Secondary | ICD-10-CM | POA: Insufficient documentation

## 2023-03-14 DIAGNOSIS — Z3A39 39 weeks gestation of pregnancy: Secondary | ICD-10-CM

## 2023-03-14 DIAGNOSIS — O99214 Obesity complicating childbirth: Secondary | ICD-10-CM

## 2023-03-14 DIAGNOSIS — O134 Gestational [pregnancy-induced] hypertension without significant proteinuria, complicating childbirth: Secondary | ICD-10-CM

## 2023-03-14 LAB — CBC
HCT: 28.1 % — ABNORMAL LOW (ref 36.0–46.0)
Hemoglobin: 8.6 g/dL — ABNORMAL LOW (ref 12.0–15.0)
MCH: 21.8 pg — ABNORMAL LOW (ref 26.0–34.0)
MCHC: 30.6 g/dL (ref 30.0–36.0)
MCV: 71.1 fL — ABNORMAL LOW (ref 80.0–100.0)
Platelets: 279 10*3/uL (ref 150–400)
RBC: 3.95 MIL/uL (ref 3.87–5.11)
RDW: 18.4 % — ABNORMAL HIGH (ref 11.5–15.5)
WBC: 22.8 10*3/uL — ABNORMAL HIGH (ref 4.0–10.5)
nRBC: 0 % (ref 0.0–0.2)

## 2023-03-14 MED ORDER — ZOLPIDEM TARTRATE 5 MG PO TABS: 5.0000 mg | ORAL_TABLET | Freq: Every evening | ORAL | Status: DC | PRN

## 2023-03-14 MED ORDER — FUROSEMIDE 20 MG PO TABS
20.0000 mg | ORAL_TABLET | Freq: Every day | ORAL | Status: DC
  Administered 2023-03-14 – 2023-03-15 (×2): 20 mg via ORAL
  Filled 2023-03-14 (×2): qty 1

## 2023-03-14 MED ORDER — SIMETHICONE 80 MG PO CHEW: 80.0000 mg | CHEWABLE_TABLET | ORAL | Status: DC | PRN

## 2023-03-14 MED ORDER — ACETAMINOPHEN 325 MG PO TABS: 650.0000 mg | ORAL_TABLET | ORAL | Status: DC | PRN

## 2023-03-14 MED ORDER — ONDANSETRON HCL 4 MG/2ML IJ SOLN: 4.0000 mg | INTRAMUSCULAR | Status: DC | PRN

## 2023-03-14 MED ORDER — WITCH HAZEL-GLYCERIN EX PADS: 1.0000 | MEDICATED_PAD | CUTANEOUS | Status: DC | PRN

## 2023-03-14 MED ORDER — DIPHENHYDRAMINE HCL 25 MG PO CAPS: 25.0000 mg | ORAL_CAPSULE | Freq: Four times a day (QID) | ORAL | Status: DC | PRN

## 2023-03-14 MED ORDER — FENTANYL CITRATE (PF) 100 MCG/2ML IJ SOLN
INTRAMUSCULAR | Status: AC
  Filled 2023-03-14: qty 2

## 2023-03-14 MED ORDER — IBUPROFEN 600 MG PO TABS
600.0000 mg | ORAL_TABLET | Freq: Four times a day (QID) | ORAL | Status: DC
  Administered 2023-03-14 – 2023-03-15 (×3): 600 mg via ORAL
  Filled 2023-03-14 (×3): qty 1

## 2023-03-14 MED ORDER — DIPHENHYDRAMINE HCL 50 MG/ML IJ SOLN
25.0000 mg | INTRAMUSCULAR | Status: DC | PRN
  Administered 2023-03-14: 25 mg via INTRAVENOUS
  Filled 2023-03-14: qty 1

## 2023-03-14 MED ORDER — LACTATED RINGERS IV SOLN: INTRAVENOUS | Status: DC

## 2023-03-14 MED ORDER — BENZOCAINE-MENTHOL 20-0.5 % EX AERO: 1.0000 | INHALATION_SPRAY | CUTANEOUS | Status: DC | PRN

## 2023-03-14 MED ORDER — ONDANSETRON HCL 4 MG PO TABS: 4.0000 mg | ORAL_TABLET | ORAL | Status: DC | PRN

## 2023-03-14 MED ORDER — COCONUT OIL OIL: 1.0000 | TOPICAL_OIL | Status: DC | PRN

## 2023-03-14 MED ORDER — MEASLES, MUMPS & RUBELLA VAC IJ SOLR: 0.5000 mL | Freq: Once | INTRAMUSCULAR | Status: DC

## 2023-03-14 MED ORDER — NIFEDIPINE ER OSMOTIC RELEASE 30 MG PO TB24
30.0000 mg | ORAL_TABLET | Freq: Every day | ORAL | Status: DC
  Administered 2023-03-14 – 2023-03-15 (×2): 30 mg via ORAL
  Filled 2023-03-14 (×2): qty 1

## 2023-03-14 MED ORDER — RHO D IMMUNE GLOBULIN 1500 UNIT/2ML IJ SOSY
300.0000 ug | PREFILLED_SYRINGE | Freq: Once | INTRAMUSCULAR | Status: AC
  Administered 2023-03-15: 300 ug via INTRAVENOUS
  Filled 2023-03-14: qty 2

## 2023-03-14 MED ORDER — FENTANYL CITRATE (PF) 100 MCG/2ML IJ SOLN
100.0000 ug | Freq: Once | INTRAMUSCULAR | Status: AC
  Administered 2023-03-14: 100 ug via INTRAVENOUS

## 2023-03-14 MED ORDER — DIBUCAINE (PERIANAL) 1 % EX OINT: 1.0000 | TOPICAL_OINTMENT | CUTANEOUS | Status: DC | PRN

## 2023-03-14 MED ORDER — SENNOSIDES-DOCUSATE SODIUM 8.6-50 MG PO TABS
2.0000 | ORAL_TABLET | ORAL | Status: DC
  Administered 2023-03-15: 2 via ORAL
  Filled 2023-03-14: qty 2

## 2023-03-14 MED ORDER — PRENATAL MULTIVITAMIN CH
1.0000 | ORAL_TABLET | Freq: Every day | ORAL | Status: DC
  Administered 2023-03-14 – 2023-03-15 (×2): 1 via ORAL
  Filled 2023-03-14 (×2): qty 1

## 2023-03-14 MED ORDER — TRANEXAMIC ACID-NACL 1000-0.7 MG/100ML-% IV SOLN
1000.0000 mg | INTRAVENOUS | Status: AC
  Administered 2023-03-14: 1000 mg via INTRAVENOUS

## 2023-03-14 MED ORDER — TETANUS-DIPHTH-ACELL PERTUSSIS 5-2.5-18.5 LF-MCG/0.5 IM SUSY: 0.5000 mL | PREFILLED_SYRINGE | Freq: Once | INTRAMUSCULAR | Status: DC

## 2023-03-14 MED ORDER — DOCUSATE SODIUM 100 MG PO CAPS
100.0000 mg | ORAL_CAPSULE | Freq: Two times a day (BID) | ORAL | Status: DC
  Administered 2023-03-15: 100 mg via ORAL
  Filled 2023-03-14 (×2): qty 1

## 2023-03-14 NOTE — Lactation Note (Addendum)
This note was copied from a baby's chart. Lactation Consultation Note  Patient Name: Judith Ruiz [id number] Date: 03/14/2023 Age:31 hours  Reason for consult: Initial assessment;Breastfeeding assistance;Term  P2, [redacted]w[redacted]d  Initial visit to see P2 mother of term infant. Mother receptive to help with latch. Baby placed in football hold on mother's left breast after review of hand expression. Baby was in a calm, alert state. He latched briefly with a few sucks and then held the breast in his mouth before falling asleep. Baby was left skin to skin with mother. Baby did not show any signs of being jittery.  Encouraged to latch baby with feeding cues, place baby skin to skin if not latching . Call for assistance with breastfeeding as needed.   Anticipate as baby approaches 24 hours of age, baby should feed more often (8-12 plus times in 24 hours) at breast called "cluster feed".     Maternal Data Has patient been taught Hand Expression?: Yes Does the patient have breastfeeding experience prior to this delivery?: Yes How long did the patient breastfeed?: few weeks  Feeding Mother's Current Feeding Choice: Breast Milk and Formula  LATCH Score Latch: Repeated attempts needed to sustain latch, nipple held in mouth throughout feeding, stimulation needed to elicit sucking reflex.  Audible Swallowing: None  Type of Nipple: Everted at rest and after stimulation  Comfort (Breast/Nipple): Soft / non-tender  Hold (Positioning): Assistance needed to correctly position infant at breast and maintain latch.  LATCH Score: 6      Interventions Interventions: Breast feeding basics reviewed;Assisted with latch;Skin to skin;Hand express;Adjust position;Support pillows;Education;LC Services brochure  Discharge Pump: DEBP;Personal  Consult Status Consult Status: Follow-up 03/15/2023 In-patient   Omar Person 03/14/2023, 3:29 PM

## 2023-03-14 NOTE — Discharge Summary (Signed)
Postpartum Discharge Summary  Date of Service updated-9/15     Patient Name: Judith Ruiz DOB: 1990/12/13 MRN: [id number]  Date of admission: 03/13/2023 Delivery date:03/14/2023 Delivering provider: Wyn Forster Date of discharge: 03/15/2023  Admitting diagnosis: Encounter for induction of labor [Z34.90] Intrauterine pregnancy: [redacted]w[redacted]d     Secondary diagnosis:  Principal Problem:   Encounter for induction of labor Active Problems:   Gestational HTN  Additional problems: morbid obesity    Discharge diagnosis: Term Pregnancy Delivered                                              Post partum procedures:rhogam Augmentation: Pitocin and Cytotec Complications: None  Hospital course: Induction of Labor With Vaginal Delivery   32 y.o. yo [id number] at [redacted]w[redacted]d was admitted to the hospital 03/13/2023 for induction of labor.  Indication for induction:  LGA .  Patient had an labor course uncomplicated. Membrane Rupture Time/Date: 8:55 AM,03/13/2023  Delivery Method:Vaginal, Spontaneous Operative Delivery:N/A Episiotomy: None Lacerations:  1st degree;Perineal Details of delivery can be found in separate delivery note.  Patient had a postpartum course complicated by GestHTN- pt wast treated with oral Lasix and procardia. Patient is discharged home 03/15/23.  Newborn Data: Birth date:03/14/2023 Birth time:11:22 AM Gender:Female Living status:Living Apgars:8 ,9  Weight:3420 g  Magnesium Sulfate received: No BMZ received: No Rhophylac:Yes MMR:Yes T-DaP:Given prenatally Flu: N/A Transfusion:No  Physical exam  Vitals:   03/14/23 1851 03/14/23 2133 03/15/23 0125 03/15/23 0520  BP: (!) 143/81 139/80 120/66 105/66  Pulse: (!) 107 (!) 106 88 81  Resp: 19 18 17 17   Temp: 98.9 F (37.2 C) 98.1 F (36.7 C) 98.6 F (37 C) 98 F (36.7 C)  TempSrc: Oral Oral  Oral  SpO2: 97%     Weight:      Height:       General: alert, cooperative, and no distress Lochia:  appropriate Uterine Fundus: firm Incision: N/A DVT Evaluation: No evidence of DVT seen on physical exam. Labs: Lab Results  Component Value Date   WBC 22.8 (H) 03/14/2023   HGB 8.6 (L) 03/14/2023   HCT 28.1 (L) 03/14/2023   MCV 71.1 (L) 03/14/2023   PLT 279 03/14/2023      Latest Ref Rng & Units 03/15/2023    5:26 AM  CMP  Glucose 70 - 99 mg/dL 91   BUN 6 - 20 mg/dL 12   Creatinine 0.27 - 1.00 mg/dL 2.53   Sodium 664 - 403 mmol/L 137   Potassium 3.5 - 5.1 mmol/L 3.7   Chloride 98 - 111 mmol/L 105   CO2 22 - 32 mmol/L 21   Calcium 8.9 - 10.3 mg/dL 9.0   Total Protein 6.5 - 8.1 g/dL 5.3   Total Bilirubin 0.3 - 1.2 mg/dL 0.6   Alkaline Phos 38 - 126 U/L 112   AST 15 - 41 U/L 14   ALT 0 - 44 U/L 15    Edinburgh Score:    03/14/2023    2:56 PM  Edinburgh Postnatal Depression Scale Screening Tool  I have been able to laugh and see the funny side of things. 0  I have looked forward with enjoyment to things. 1  I have blamed myself unnecessarily when things went wrong. 1  I have been anxious or worried for no good reason. 1  I have felt scared or panicky  for no good reason. 1  Things have been getting on top of me. 0  I have been so unhappy that I have had difficulty sleeping. 0  I have felt sad or miserable. 1  I have been so unhappy that I have been crying. 1  The thought of harming myself has occurred to me. 0  Edinburgh Postnatal Depression Scale Total 6     After visit meds:  Allergies as of 03/15/2023   No Known Allergies      Medication List     TAKE these medications    acetaminophen 325 MG tablet Commonly known as: Tylenol Take 2 tablets (650 mg total) by mouth every 4 (four) hours as needed (for pain scale < 4).   furosemide 20 MG tablet Commonly known as: LASIX Take 1 tablet (20 mg total) by mouth daily for 5 days.   ibuprofen 600 MG tablet Commonly known as: ADVIL Take 1 tablet (600 mg total) by mouth every 6 (six) hours.   Iron 325 (65 Fe) MG  Tabs Take by mouth.   multivitamin with minerals Tabs tablet Take 1 tablet by mouth daily.   NIFEdipine 30 MG 24 hr tablet Commonly known as: ADALAT CC Take 1 tablet (30 mg total) by mouth daily.         Discharge home in stable condition Infant Feeding: Breast Infant Disposition:home with mother Discharge instruction: per After Visit Summary and Postpartum booklet. Activity: Advance as tolerated. Pelvic rest for 6 weeks.  Diet: routine diet Future Appointments: Future Appointments  Date Time Provider Department Center  04/23/2023  9:55 AM Levie Heritage, DO [facility] None   Follow up Visit:  Follow-up Information     [facility]. Go to.   Specialty: Obstetrics and Gynecology Why: Please follow up in one week for a BP check Contact information: [address] [place] [address] [phone]               Message sent to [facility] 9/14  Please schedule this patient for a In person postpartum visit in 6 weeks with the following provider: Any provider. Additional Postpartum F/U:Postpartum Depression checkup and BP check 1 week  High risk pregnancy complicated by: HTN Delivery mode:  Vaginal, Spontaneous Anticipated Birth Control:   Declined   03/15/2023 Sharon Seller, DO

## 2023-03-14 NOTE — Progress Notes (Signed)
Labor Progress Note  Judith Ruiz is a 32 y.o. G2P1001 at [redacted]w[redacted]d presented for  IOL given LGA (EFW 97% 3924g @[redacted]w[redacted]d )   S: resting comfortably in bed, feeling more pressure with each contraction  O:  BP (!) 145/73   Pulse (!) 103   Temp 98.9 F (37.2 C) (Oral)   Resp 16   Ht 5\' 5"  (1.651 m)   Wt (!) 149.7 kg   LMP 06/12/2022 (Approximate)   SpO2 99%   BMI 54.91 kg/m  EFM:150bpm/Mild variability/ 15x15 accels/ None decels CAT: 1 Toco: q71mins    CVE: Dilation: 8 Effacement (%): 90 Station: 0 Presentation: Vertex Exam by:: Mittie Bodo, MD    A&P: 32 y.o. [id number] [redacted]w[redacted]d  here for IOL as above  #Labor: Progressing well despite last hour MVUs inadequate beign 185 > 175.   #Pain: Epidural #FWB: CAT 1 #GBS negative  #BP: max SBP 159, DBP 87, mild headache. All improved after epidural.   [name], MD FMOB Fellow, Faculty practice [facility], [facility] 03/14/23  4:54 AM

## 2023-03-14 NOTE — Progress Notes (Signed)
Labor Progress Note  Judith Ruiz is a 32 y.o. G2P1001 at [redacted]w[redacted]d presented for IOL given LGA (EFW 97% 3924g @[redacted]w[redacted]d )   S: per nursing patient comfortable, still having shallow variables intermittently with amnioinfusion 125cc/hr, CTX MVU 180s.    O:  BP 123/67   Pulse (!) 112   Temp 98.7 F (37.1 C) (Oral)   Resp 16   Ht 5\' 5"  (1.651 m)   Wt (!) 149.7 kg   LMP 06/12/2022 (Approximate)   SpO2 99%   BMI 54.91 kg/m  EFM:145bpm/Moderate variability/ 15x15 accels/ Variable decels CAT: 1 Toco: q2-44mins   CVE: Dilation: 6.5 Effacement (%): 80 Station: -3 Presentation: Vertex Exam by:: Olivia Mackie RN   A&P: 32 y.o. [id number] [redacted]w[redacted]d  here for IOL as above  #Labor: Has not progressed much from last check 2 hours ago, will up titrate pitocin but given variables will also increase rate of amnioinfusion to 150cc/hr. Maternal tachycardia so will also given 500cc / hr bolus.  Afebrile.  Nursing believes 2/2 increased pressure.  #Pain: Epidural #FWB: CAT 1 #GBS negative  #BP: max SBP 159, DBP 87, mild headache. All improved after epidural.   [name], MD FMOB Fellow, Faculty practice [facility], Center for [facility] 03/14/23  12:46 AM

## 2023-03-15 LAB — COMPREHENSIVE METABOLIC PANEL
ALT: 15 U/L (ref 0–44)
AST: 14 U/L — ABNORMAL LOW (ref 15–41)
Albumin: 2.3 g/dL — ABNORMAL LOW (ref 3.5–5.0)
Alkaline Phosphatase: 112 U/L (ref 38–126)
Anion gap: 11 (ref 5–15)
BUN: 12 mg/dL (ref 6–20)
CO2: 21 mmol/L — ABNORMAL LOW (ref 22–32)
Calcium: 9 mg/dL (ref 8.9–10.3)
Chloride: 105 mmol/L (ref 98–111)
Creatinine, Ser: 0.86 mg/dL (ref 0.44–1.00)
GFR, Estimated: >60 mL/min (ref >60)
Glucose, Bld: 91 mg/dL (ref 70–99)
Potassium: 3.7 mmol/L (ref 3.5–5.1)
Sodium: 137 mmol/L (ref 135–145)
Total Bilirubin: 0.6 mg/dL (ref 0.3–1.2)
Total Protein: 5.3 g/dL — ABNORMAL LOW (ref 6.5–8.1)

## 2023-03-15 MED ORDER — FUROSEMIDE 20 MG PO TABS: 20.0000 mg | ORAL_TABLET | Freq: Every day | ORAL | 0 refills | Status: DC

## 2023-03-15 MED ORDER — ACETAMINOPHEN 325 MG PO TABS: 650.0000 mg | ORAL_TABLET | ORAL | Status: AC | PRN

## 2023-03-15 MED ORDER — NIFEDIPINE ER 30 MG PO TB24: 30.0000 mg | ORAL_TABLET | Freq: Every day | ORAL | 0 refills | Status: DC

## 2023-03-15 MED ORDER — RHO D IMMUNE GLOBULIN 1500 UNIT/2ML IJ SOSY
300.0000 ug | PREFILLED_SYRINGE | Freq: Once | INTRAMUSCULAR | Status: DC
  Filled 2023-03-15: qty 2

## 2023-03-15 MED ORDER — IBUPROFEN 600 MG PO TABS: 600.0000 mg | ORAL_TABLET | Freq: Four times a day (QID) | ORAL | 0 refills | Status: AC

## 2023-03-15 NOTE — Anesthesia Postprocedure Evaluation (Signed)
Anesthesia Post Note  Patient: Judith Ruiz  Procedure(s) Performed: AN AD HOC LABOR EPIDURAL     Patient location during evaluation: Mother Baby Anesthesia Type: Epidural Level of consciousness: awake Pain management: satisfactory to patient Vital Signs Assessment: post-procedure vital signs reviewed and stable Respiratory status: spontaneous breathing Cardiovascular status: stable Anesthetic complications: no  No notable events documented.  Last Vitals:  Vitals:   03/15/23 0125 03/15/23 0520  BP: 120/66 105/66  Pulse: 88 81  Resp: 17 17  Temp: 37 C 36.7 C  SpO2:      Last Pain:  Vitals:   03/15/23 0520  TempSrc: Oral  PainSc:    Pain Goal: Patients Stated Pain Goal: 0 (03/14/23 0850)                 Cephus Shelling

## 2023-03-15 NOTE — Plan of Care (Signed)
Progressing with education

## 2023-03-16 LAB — RH IG WORKUP (INCLUDES ABO/RH)
Fetal Screen: NEGATIVE
Gestational Age(Wks): 39
Unit division: 0

## 2023-03-21 IMAGING — CT CT HEART MORP W/ CTA COR W/ SCORE W/ CA W/CM &/OR W/O CM
4 of 7 series · 8 of 20 positions shown, 9 images · IV contrast (APPLIED)
Comparison: CT 06/21/2021

Addendum:
CLINICAL DATA: 30 yo female with chest pain

EXAM:
Cardiac/Coronary CTA
TECHNIQUE: A non-contrast, gated CT scan was obtained with axial slices of 3 mm
through the heart for calcium scoring. Calcium scoring was performed
using the Agatston method. A 120 kV prospective, gated, contrast
cardiac scan was obtained. Gantry rotation speed was 250 msecs and
collimation was 0.6 mm. Two sublingual nitroglycerin tablets (0.8
mg) were given. The 3D data set was reconstructed in 5% intervals of
the 35-75% of the R-R cycle. Diastolic phases were analyzed on a
dedicated workstation using MPR, MIP, and VRT modes. The patient
received 95 cc of contrast.

[Series 6: best syst · axial · 0.39mm/px · z∈[-239,-200]mm · 2 of 288 slices shown, 3 images]
[im 96/288  vessel]
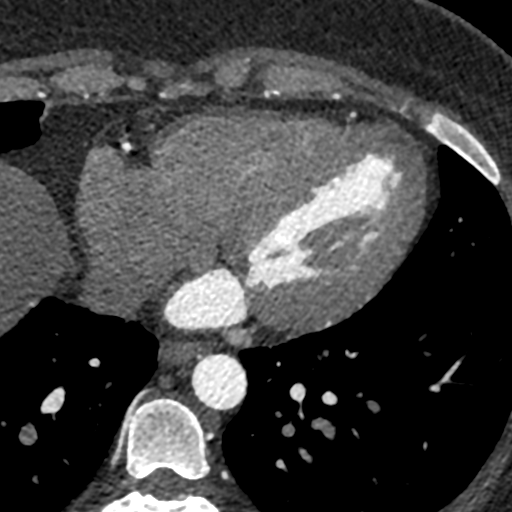
[im 96/288  lung]
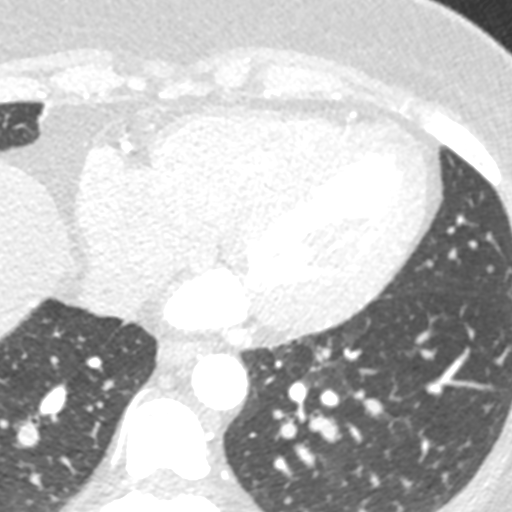
[im 192/288  vessel]
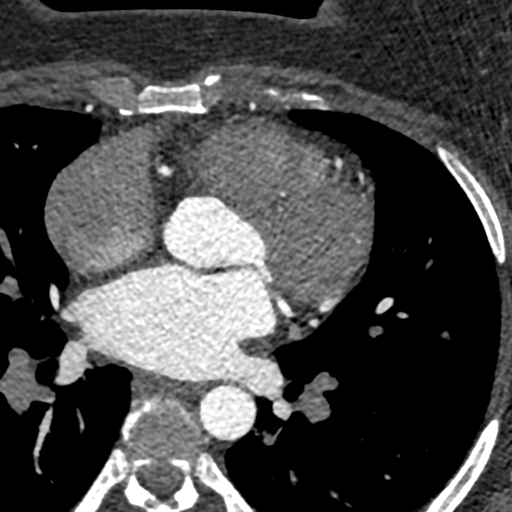

[Series 7: best diast · axial · 0.39mm/px · z∈[-239,-200]mm · 2 of 288 slices shown]
[im 96/288  vessel]
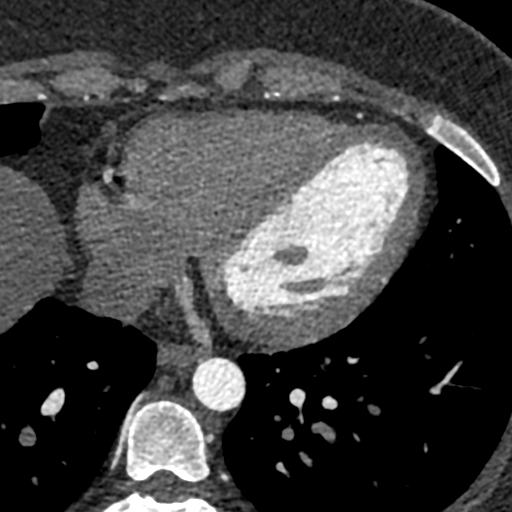
[im 192/288  vessel]
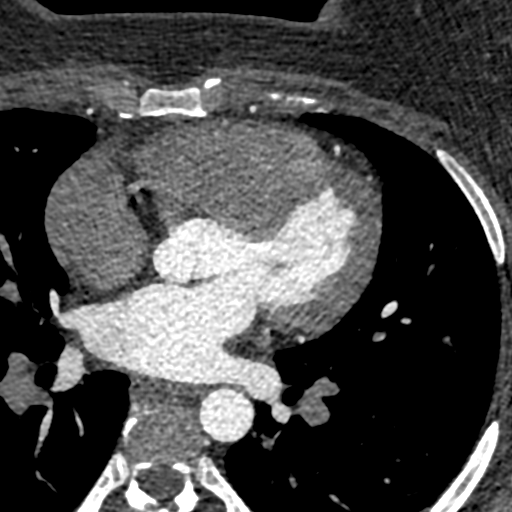

[Series 8: ts diast · axial · 0.39mm/px · z∈[-239,-200]mm · 2 of 288 slices shown]
[im 96/288  vessel]
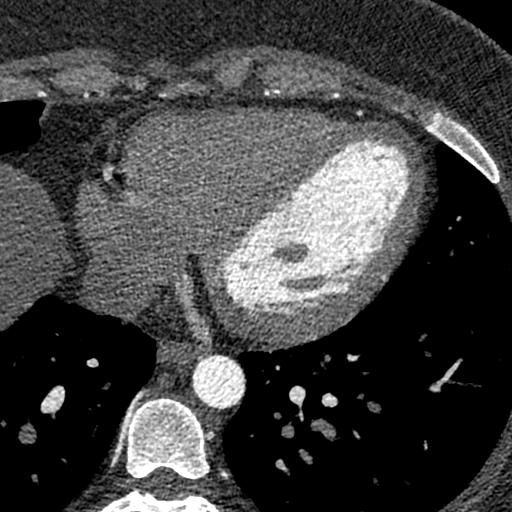
[im 192/288  vessel]
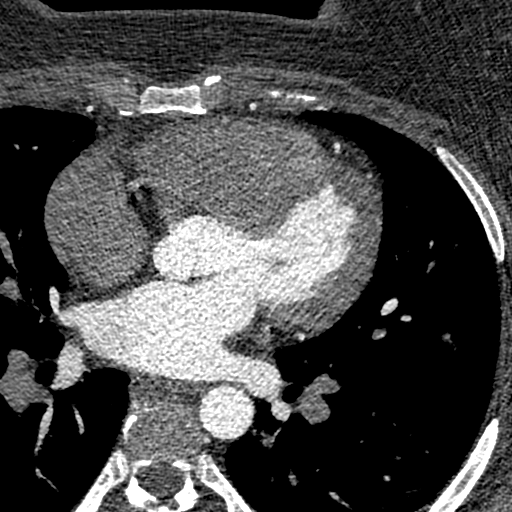

[Series 9: ts syst · axial · 0.39mm/px · z∈[-239,-200]mm · 2 of 288 slices shown]
[im 96/288  vessel]
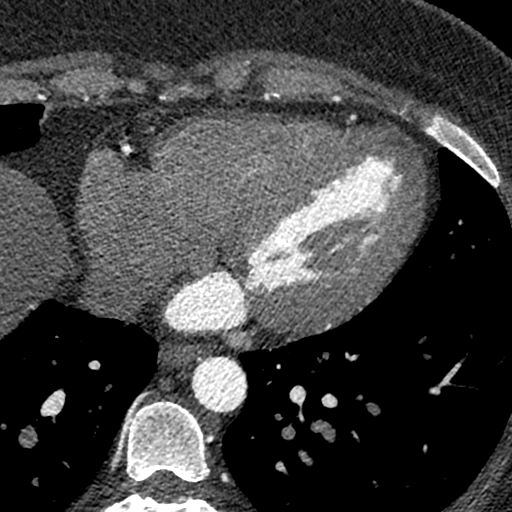
[im 192/288  vessel]
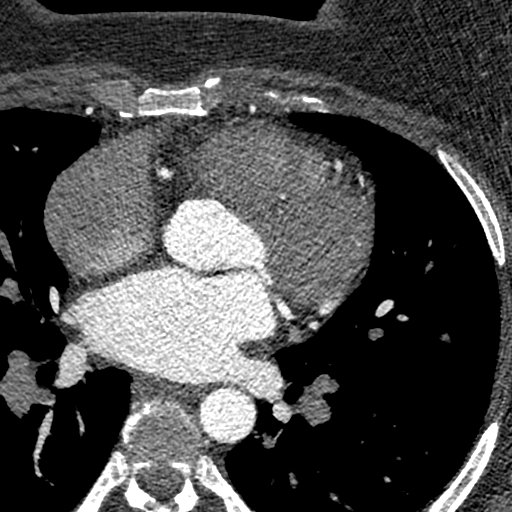

[8 of 20 positions shown; findings below may reference images not displayed]

FINDINGS: Image quality: Average.

Noise artifact is: Limited.

Coronary Arteries:  Normal coronary origin.  Right dominance.

Left main: The left main is a large caliber vessel with a normal
take off from the left coronary cusp that bifurcates to form a left
anterior descending artery and a left circumflex artery. There is no
plaque or stenosis.

Left anterior descending artery: The LAD is patent without evidence
of plaque or stenosis. The LAD gives off large, branching patent D1.

Left circumflex artery: The LCX is non-dominant and patent with no
evidence of plaque or stenosis. The LCX gives off 2 patent obtuse
marginal branches.

Right coronary artery: The RCA is dominant with normal take off from
the right coronary cusp. There is no evidence of plaque or stenosis.
The RCA terminates as a PDA and right posterolateral branch without
evidence of plaque or stenosis.

Right Atrium: Right atrial size is within normal limits.

Right Ventricle: The right ventricular cavity is within normal
limits.

Left Atrium: Left atrial size is normal in size with no left atrial
appendage filling defect.

Left Ventricle: The ventricular cavity size is within normal limits.
There are no stigmata of prior infarction. There is no abnormal
filling defect.

Pulmonary arteries: Normal in size.

Pulmonary veins: Normal pulmonary venous drainage.

Pericardium: Normal thickness with no significant effusion or
calcium present.

Cardiac valves: The aortic valve is trileaflet without significant
calcification. The mitral valve is normal structure without
significant calcification.

Aorta: Normal caliber with no significant disease.

Extra-cardiac findings: See attached radiology report for
non-cardiac structures.
IMPRESSION: 1. Coronary calcium score of 0. This was 0 percentile for age-, sex,
and race-matched controls.

2. Normal coronary origin with right dominance.

3. No evidence of CAD.

RECOMMENDATIONS:
CAD-RADS 0: No evidence of CAD (0%). Consider non-atherosclerotic
causes of chest pain.

EXAM:
OVER-READ INTERPRETATION  CT CHEST

The following report is an over-read performed by radiologist Dr.
does not include interpretation of cardiac or coronary anatomy or
pathology. The coronary CTA interpretation by the cardiologist is
attached.
FINDINGS: Vascular: Normal size main and branch pulmonary arteries.The
ascending aorta is normal in size.The descending aorta is normal in
size.There are 4 pulmonary veins.

Mediastinum/Nodes: No lymphadenopathy within the field of view.

Lungs/Pleura: No focal airspace consolidation, suspicious pulmonary
nodules, pleural effusion, or pneumothorax within the field of view.

Upper Abdomen: No acute abnormality.

Musculoskeletal: No acute osseous abnormality.No suspicious lytic or
blastic lesions.
IMPRESSION: No acute or incidental extracardiac findings within the field of
view.

*** End of Addendum ***
FINDINGS: Image quality: Average.

Noise artifact is: Limited.

Coronary Arteries:  Normal coronary origin.  Right dominance.

Left main: The left main is a large caliber vessel with a normal
take off from the left coronary cusp that bifurcates to form a left
anterior descending artery and a left circumflex artery. There is no
plaque or stenosis.

Left anterior descending artery: The LAD is patent without evidence
of plaque or stenosis. The LAD gives off large, branching patent D1.

Left circumflex artery: The LCX is non-dominant and patent with no
evidence of plaque or stenosis. The LCX gives off 2 patent obtuse
marginal branches.

Right coronary artery: The RCA is dominant with normal take off from
the right coronary cusp. There is no evidence of plaque or stenosis.
The RCA terminates as a PDA and right posterolateral branch without
evidence of plaque or stenosis.

Right Atrium: Right atrial size is within normal limits.

Right Ventricle: The right ventricular cavity is within normal
limits.

Left Atrium: Left atrial size is normal in size with no left atrial
appendage filling defect.

Left Ventricle: The ventricular cavity size is within normal limits.
There are no stigmata of prior infarction. There is no abnormal
filling defect.

Pulmonary arteries: Normal in size.

Pulmonary veins: Normal pulmonary venous drainage.

Pericardium: Normal thickness with no significant effusion or
calcium present.

Cardiac valves: The aortic valve is trileaflet without significant
calcification. The mitral valve is normal structure without
significant calcification.

Aorta: Normal caliber with no significant disease.

Extra-cardiac findings: See attached radiology report for
non-cardiac structures.
IMPRESSION: 1. Coronary calcium score of 0. This was 0 percentile for age-, sex,
and race-matched controls.

2. Normal coronary origin with right dominance.

3. No evidence of CAD.

RECOMMENDATIONS:
CAD-RADS 0: No evidence of CAD (0%). Consider non-atherosclerotic
causes of chest pain.

## 2023-04-11 ENCOUNTER — Telehealth (HOSPITAL_COMMUNITY): Payer: Self-pay

## 2023-04-11 NOTE — Telephone Encounter (Signed)
04/11/2023 1846  Name: Judith Ruiz MRN: [id number] DOB: May 27, 1991  Reason for Call:  Transition of Care Hospital Discharge Call  Contact Status: Patient Contact Status: Complete  Language assistant needed: Interpreter Mode: Interpreter Not Needed        Follow-Up Questions: Do You Have Any Concerns About Your Health As You Heal From Delivery?: No Do You Have Any Concerns About Your Infants Health?: No  Edinburgh Postnatal Depression Scale:  In the Past 7 Days: I have been able to laugh and see the funny side of things.: As much as I always could I have looked forward with enjoyment to things.: As much as I ever did I have blamed myself unnecessarily when things went wrong.: Yes, some of the time I have been anxious or worried for no good reason.: Yes, sometimes I have felt scared or panicky for no good reason.: No, not much Things have been getting on top of me.: No, most of the time I have coped quite well I have been so unhappy that I have had difficulty sleeping.: Not at all I have felt sad or miserable.: Not very often I have been so unhappy that I have been crying.: No, never The thought of harming myself has occurred to me.: Never Edinburgh Postnatal Depression Scale Total: 7  PHQ2-9 Depression Scale:     Discharge Follow-up: Edinburgh score requires follow up?: No Patient was advised of the following resources:: Breastfeeding Support Group, Support Group  Post-discharge interventions: Reviewed Newborn Safe Sleep Practices  Signature  Signe Colt

## 2023-04-23 ENCOUNTER — Encounter: Payer: Self-pay | Admitting: Family Medicine

## 2023-04-23 ENCOUNTER — Ambulatory Visit: Payer: BC Managed Care – PPO | Admitting: Family Medicine

## 2023-04-23 DIAGNOSIS — Z8759 Personal history of other complications of pregnancy, childbirth and the puerperium: Secondary | ICD-10-CM

## 2023-04-23 NOTE — Progress Notes (Signed)
Post Partum Visit Note  Judith Ruiz is a 32 y.o. G5P2002 female who presents for a postpartum visit. She is 5 weeks postpartum following a normal spontaneous vaginal delivery.  I have fully reviewed the prenatal and intrapartum course. The delivery was at 37 gestational weeks.  Anesthesia: epidural. Postpartum course has been normal. Baby is doing well. Baby is feeding by bottle - Similac Advance. Bleeding staining only. Bowel function is normal. Bladder function is normal. Patient is not sexually active. Contraception method is none. Postpartum depression screening: negative.   The pregnancy intention screening data noted above was reviewed. Potential methods of contraception were discussed. The patient elected to proceed with No data recorded.   Edinburgh Postnatal Depression Scale - 04/23/23 1035       Edinburgh Postnatal Depression Scale:  In the Past 7 Days   I have been able to laugh and see the funny side of things. 0    I have looked forward with enjoyment to things. 0    I have blamed myself unnecessarily when things went wrong. 2    I have been anxious or worried for no good reason. 1    I have felt scared or panicky for no good reason. 1    Things have been getting on top of me. 0    I have been so unhappy that I have had difficulty sleeping. 0    I have felt sad or miserable. 1    I have been so unhappy that I have been crying. 0    The thought of harming myself has occurred to me. 0    Edinburgh Postnatal Depression Scale Total 5             Health Maintenance Due  Topic Date Due   HPV VACCINES (2 - 3-dose series) 09/21/2014   INFLUENZA VACCINE  Never done   COVID-19 Vaccine (1 - 2023-24 season) Never done    The following portions of the patient's history were reviewed and updated as appropriate: allergies, current medications, past family history, past medical history, past social history, past surgical history, and problem list.  Review of  Systems Pertinent items are noted in HPI.  Objective:  BP 135/88   Pulse 82   Wt 296 lb (134.3 kg)   LMP 06/12/2022 (Approximate)   Breastfeeding No   BMI 49.26 kg/m    General:  alert, cooperative, and no distress   Breasts:  not indicated  Lungs: clear to auscultation bilaterally  Heart:  regular rate and rhythm, S1, S2 normal, no murmur, click, rub or gallop  Abdomen: soft, non-tender; bowel sounds normal; no masses,  no organomegaly   Wound N/a  GU exam:  not indicated       Assessment:     1. Postpartum exam   2. History of gestational hypertension      Plan:   Essential components of care per ACOG recommendations:  1.  Mood and well being: Patient with negative depression screening today. Reviewed local resources for support.  - Patient tobacco use? No.   - hx of drug use? No.    2. Infant care and feeding:  -Patient currently breastmilk feeding? No.  -Social determinants of health (SDOH) reviewed in EPIC. No concerns  3. Sexuality, contraception and birth spacing - Patient does not want a pregnancy in the next year.    - Reviewed reproductive life planning. Reviewed contraceptive methods based on pt preferences and effectiveness.  Patient desired Female Condom  today.   - Discussed birth spacing of 18 months  4. Sleep and fatigue -Encouraged family/partner/community support of 4 hrs of uninterrupted sleep to help with mood and fatigue  5. Physical Recovery  - Discussed patients delivery and complications. She describes her labor as good. - Patient had a Vaginal, no problems at delivery. Patient had a 1st degree laceration. Perineal healing reviewed. Patient expressed understanding - Patient has urinary incontinence? No. - Patient is safe to resume physical and sexual activity  6.  Health Maintenance - HM due items addressed Yes - Last pap smear No results found for: "DIAGPAP"  Last PAP 07/2020 - normal. Due in 2025. Pap smear not done at today's visit.   -Breast Cancer screening indicated? No.   7. Chronic Disease/Pregnancy Condition follow up: None  - PCP follow up  Levie Heritage, DO [facility], [facility]

## 2023-06-22 ENCOUNTER — Ambulatory Visit
Admission: EM | Admit: 2023-06-22 | Discharge: 2023-06-22 | Disposition: A | Discharge status: Home / Self Care | Payer: BC Managed Care – PPO | Attending: Family Medicine | Admitting: Family Medicine

## 2023-06-22 DIAGNOSIS — N760 Acute vaginitis: Secondary | ICD-10-CM | POA: Diagnosis present

## 2023-06-22 DIAGNOSIS — B3731 Acute candidiasis of vulva and vagina: Secondary | ICD-10-CM | POA: Insufficient documentation

## 2023-06-22 DIAGNOSIS — B372 Candidiasis of skin and nail: Secondary | ICD-10-CM | POA: Diagnosis present

## 2023-06-22 MED ORDER — NYSTATIN 100000 UNIT/GM EX CREA: TOPICAL_CREAM | CUTANEOUS | 0 refills | Status: AC

## 2023-06-22 MED ORDER — FLUCONAZOLE 150 MG PO TABS: ORAL_TABLET | ORAL | 0 refills | Status: AC

## 2023-06-22 NOTE — Discharge Instructions (Signed)
You were seen today for vaginal yeast infection and yeast of the belly button.  I have sent out an oral pill for the vaginal yeast infection, and a topical cream for the belly button rash to use twice/day x 7 days.  Your swab will be resulted tomorrow and if there is anything else that needs to be treated we will call to notify you.

## 2023-06-22 NOTE — ED Triage Notes (Signed)
Pt presents to UC for c/o vaginal discharge and vaginal itching since yesterday. Pt also reports discharge from belly button starting today. Pt states she finished a round of abx 3 days ago for dental procedure.

## 2023-06-22 NOTE — ED Provider Notes (Signed)
[facility]    CSN: [id number] Arrival date & time: 06/22/23  0818      History   Chief Complaint No chief complaint on file.   HPI Judith Ruiz is a 32 y.o. female.   Patient is here for vaginal d/c, itching.  White, thick.  She finished abx this week  No concern for STD.   She noted some clear liquid from her belly button this morning, foul odor.  No fevers/chills.  No abd pain.        Past Medical History:  Diagnosis Date   Obesity     Patient Active Problem List   Diagnosis Date Noted   BMI 50.0-59.9, adult (HCC) 03/12/2023   RhD negative 02/06/2022   Obesity in pregnancy 01/04/2015    Past Surgical History:  Procedure Laterality Date   NO PAST SURGERIES      OB History     Gravida  2   Para  2   Term  2   Preterm      AB      Living  2      SAB      IAB      Ectopic      Multiple  0   Live Births  2            Home Medications    Prior to Admission medications   Medication Sig Start Date End Date Taking? Authorizing Provider  Ferrous Sulfate (IRON) 325 (65 Fe) MG TABS Take by mouth.   Yes [provider]  Multiple Vitamin (MULTIVITAMIN WITH MINERALS) TABS tablet Take 1 tablet by mouth daily.   Yes [provider]  acetaminophen (TYLENOL) 325 MG tablet Take 2 tablets (650 mg total) by mouth every 4 (four) hours as needed (for pain scale < 4). 03/15/23   Myna Hidalgo, DO  ibuprofen (ADVIL) 600 MG tablet Take 1 tablet (600 mg total) by mouth every 6 (six) hours. 03/15/23   Myna Hidalgo, DO    Family History Family History  Problem Relation Age of Onset   Hyperlipidemia Father    Coronary artery disease Paternal Grandmother    Coronary artery disease Paternal Grandfather     Social History Social History   Tobacco Use   Smoking status: Never   Smokeless tobacco: Never  Vaping Use   Vaping status: Never Used  Substance Use Topics   Alcohol use: No    Alcohol/week: 0.0  standard drinks of alcohol   Drug use: No     Allergies   Patient has no known allergies.   Review of Systems Review of Systems  Constitutional: Negative.   HENT: Negative.    Respiratory: Negative.    Cardiovascular: Negative.   Gastrointestinal: Negative.   Genitourinary:  Positive for vaginal discharge.  Skin:  Positive for rash.     Physical Exam Triage Vital Signs ED Triage Vitals  Encounter Vitals Group     BP 06/22/23 0828 127/82     Systolic BP Percentile --      Diastolic BP Percentile --      Pulse Rate 06/22/23 0828 77     Resp 06/22/23 0828 16     Temp 06/22/23 0828 98.3 F (36.8 C)     Temp Source 06/22/23 0828 Oral     SpO2 06/22/23 0828 98 %     Weight --      Height --      Head Circumference --  Peak Flow --      Pain Score 06/22/23 0827 0     Pain Loc --      Pain Education --      Exclude from Growth Chart --    No data found.  Updated Vital Signs BP 127/82 (BP Location: Right Arm)   Pulse 77   Temp 98.3 F (36.8 C) (Oral)   Resp 16   LMP 06/08/2023 (Approximate)   SpO2 98%   Visual Acuity Right Eye Distance:   Left Eye Distance:   Bilateral Distance:    Right Eye Near:   Left Eye Near:    Bilateral Near:     Physical Exam Constitutional:      Appearance: Normal appearance.  Cardiovascular:     Rate and Rhythm: Normal rate and regular rhythm.  Pulmonary:     Effort: Pulmonary effort is normal.     Breath sounds: Normal breath sounds.  Skin:    Comments: There is redness/irritation to the umbilicus, satellite lesion noted within the umbilicus;  no d/c noted on exam  Neurological:     General: No focal deficit present.     Mental Status: She is alert.  Psychiatric:        Mood and Affect: Mood normal.      UC Treatments / Results  Labs (all labs ordered are listed, but only abnormal results are displayed) Labs Reviewed  CERVICOVAGINAL ANCILLARY ONLY    EKG   Radiology No results  found.  Procedures Procedures (including critical care time)  Medications Ordered in UC Medications - No data to display  Initial Impression / Assessment and Plan / UC Course  I have reviewed the triage vital signs and the nursing notes.  Pertinent labs & imaging results that were available during my care of the patient were reviewed by me and considered in my medical decision making (see chart for details).  Final Clinical Impressions(s) / UC Diagnoses   Final diagnoses:  Vaginitis and vulvovaginitis  Skin yeast infection     Discharge Instructions      You were seen today for vaginal yeast infection and yeast of the belly button.  I have sent out an oral pill for the vaginal yeast infection, and a topical cream for the belly button rash to use twice/day x 7 days.  Your swab will be resulted tomorrow and if there is anything else that needs to be treated we will call to notify you.      ED Prescriptions     Medication Sig Dispense Auth. Provider   fluconazole (DIFLUCAN) 150 MG tablet 1 tab po x 1, may repeat in 3 days 2 tablet Angelea Penny, MD   nystatin cream (MYCOSTATIN) Apply to affected area 2 times daily 30 g Jannifer Franklin, MD      PDMP not reviewed this encounter.   Jannifer Franklin, MD 06/22/23 [phone]

## 2023-06-23 LAB — CERVICOVAGINAL ANCILLARY ONLY
Bacterial Vaginitis (gardnerella): NEGATIVE
Candida Glabrata: NEGATIVE
Candida Vaginitis: POSITIVE — AB
Chlamydia: NEGATIVE
Comment: NEGATIVE
Comment: NEGATIVE
Comment: NEGATIVE
Comment: NEGATIVE
Comment: NEGATIVE
Comment: NORMAL
Neisseria Gonorrhea: NEGATIVE
Trichomonas: NEGATIVE
